# Patient Record
Sex: Male | Born: 1990 | Race: White | Hispanic: No | Marital: Married | Smoking: Light tobacco smoker
Health system: Southern US, Community
[De-identification: ages and names within clinical notes are randomized; demographics above are authoritative.]

## PROBLEM LIST (undated history)

## (undated) DIAGNOSIS — L709 Acne, unspecified: Secondary | ICD-10-CM

## (undated) DIAGNOSIS — Z227 Latent tuberculosis: Secondary | ICD-10-CM

## (undated) HISTORY — DX: Acne, unspecified: L70.9

## (undated) HISTORY — DX: Latent tuberculosis: Z22.7

---
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
## 2010-04-23 ENCOUNTER — Ambulatory Visit: Payer: Self-pay | Admitting: Internal Medicine

## 2010-04-26 LAB — CONVERTED CEMR LAB
ALT: 15 units/L (ref 0–53)
Alkaline Phosphatase: 75 units/L (ref 39–117)
BUN: 15 mg/dL (ref 6–23)
Basophils Relative: 0.5 % (ref 0.0–3.0)
Bilirubin, Direct: 0.1 mg/dL (ref 0.0–0.3)
Calcium: 9.3 mg/dL (ref 8.4–10.5)
Cholesterol: 199 mg/dL (ref 0–200)
Creatinine, Ser: 0.9 mg/dL (ref 0.4–1.5)
Eosinophils Relative: 5 % (ref 0.0–5.0)
GFR calc non Af Amer: 119.89 mL/min (ref >60)
LDL Cholesterol: 130 mg/dL — ABNORMAL HIGH (ref 0–99)
Lymphocytes Relative: 42.3 % (ref 12.0–46.0)
Monocytes Absolute: 0.4 10*3/uL (ref 0.1–1.0)
Neutrophils Relative %: 44.7 % (ref 43.0–77.0)
Platelets: 293 10*3/uL (ref 150.0–400.0)
RBC: 5.23 M/uL (ref 4.22–5.81)
Total Bilirubin: 0.7 mg/dL (ref 0.3–1.2)
Total CHOL/HDL Ratio: 4
Total Protein: 7.3 g/dL (ref 6.0–8.3)
Triglycerides: 108 mg/dL (ref 0.0–149.0)
VLDL: 21.6 mg/dL (ref 0.0–40.0)
WBC: 5.8 10*3/uL (ref 4.5–10.5)

## 2010-10-16 NOTE — Progress Notes (Signed)
Summary: Mom want pt to have Antibiotic  Phone Note Call from Patient   Caller: Mom Call For: Stacie Glaze MD Summary of Call: Pt's Mom wants you to know pt had fever 102.8 Sat, 101 Sun, and 100 yesterday.  Mom really wants him to get an antibiotic today. Initial call taken by: Lynann Beaver CMA,  September 19, 2009 1:38 PM

## 2010-10-16 NOTE — Assessment & Plan Note (Signed)
Summary: SINUSITIS // RS   Vital Signs:  Patient profile:   20 year old male Height:      74.75 inches Weight:      169 pounds BMI:     21.34 Temp:     97.9 degrees F oral Pulse rate:   72 / minute Resp:     12 per minute BP sitting:   110 / 70  (left arm)  Vitals Entered By: Willy Eddy, LPN (September 19, 2009 3:48 PM) CC: c/o sinusitis like sx with facial pressure and fever at home   CC:  c/o sinusitis like sx with facial pressure and fever at home.  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 Use As Directed  Current Medications (verified): 1)  Accutane 80 .... Use As Directed 2)  Amoxicillin 500 Mg Caps (Amoxicillin) .... One By Mouth Three Times A Day 3)  Fexofenadine-Pseudoephedrine 60-120 Mg Xr12h-Tab (Fexofenadine-Pseudoephedrine) .... One By Mouth Two Times A Day For 10 Days  Allergies (verified): No Known Drug Allergies  Past History:  Family History: Last updated: 06/15/2007 Family History Depression Family History of Neurological disorder  Social History: Last updated: 06/15/2007 Single  Past medical, surgical, family and social histories (including risk factors) reviewed, and no changes noted (except as noted below).  Past Medical History: Reviewed history from 08/24/2007 and no changes required. acne  Past Surgical History: Reviewed history from 06/15/2007 and no changes required. Denies surgical history  Family History: Reviewed history from 06/15/2007 and no changes  required. Family History Depression Family History of Neurological disorder  Social History: Reviewed history from 06/15/2007 and no changes required. Single  Review of Systems  The patient denies anorexia, fever, weight loss, weight gain, vision loss, decreased hearing, hoarseness, chest pain, syncope, dyspnea on exertion, peripheral edema, prolonged cough, headaches, hemoptysis, abdominal pain, melena, hematochezia, severe indigestion/heartburn, hematuria, incontinence, genital sores, muscle weakness, suspicious skin lesions, transient blindness, difficulty walking, depression, unusual weight change, abnormal bleeding, enlarged lymph nodes, angioedema, and breast masses.    Physical Exam  General:  Well-developed,well-nourished,in no acute distress; alert,appropriate and cooperative throughout examination Head:  Normocephalic and atraumatic without obvious abnormalities. No apparent alopecia or balding. Eyes:  No corneal or conjunctival inflammation noted. EOMI. Perrla. Funduscopic exam benign, without hemorrhages, exudates or papilledema. Vision grossly normal. Ears:  R ear normal and L ear normal.   Nose:  no external deformity and no nasal discharge.   Neck:  No deformities, masses, or tenderness noted. Lungs:  Normal respiratory effort, chest expands symmetrically. Lungs are clear to auscultation, no crackles or wheezes. Heart:  Normal rate and regular rhythm. S1 and S2 normal without gallop, murmur, click, rub or other extra sounds.   Impression & Recommendations:  Problem # 1:  ACUTE MAXILLARY SINUSITIS (ICD-461.0)  Instructed on treatment. Call if symptoms persist or worsen.   His updated medication list for this problem includes:    Amoxicillin 500 Mg Caps (Amoxicillin) ..... One by mouth three times a  day    Fexofenadine-pseudoephedrine 60-120 Mg Xr12h-tab (Fexofenadine-pseudoephedrine) ..... One by mouth two times a day for 10 days  Complete Medication List: 1)   Accutane 80  .... Use as directed 2)  Amoxicillin 500 Mg Caps (Amoxicillin) .... One by mouth three times a day 3)  Fexofenadine-pseudoephedrine 60-120 Mg Xr12h-tab (Fexofenadine-pseudoephedrine) .... One by mouth two times a day for 10 days  Patient Instructions: 1)  Please schedule an appointment with your primary doctor in : 2)  Take your antibiotic as prescribed until ALL of it is gone, but stop if you develop a rash or swelling and contact our office as soon as possible. Prescriptions: FEXOFENADINE-PSEUDOEPHEDRINE 60-120 MG XR12H-TAB (FEXOFENADINE-PSEUDOEPHEDRINE) one by mouth two times a day for 10 days  #20 x 0   Entered and Authorized by:   Stacie Glaze MD   Signed by:   Stacie Glaze MD on 09/19/2009   Method used:   Electronically to        [id number] (retail)       [address]       [place], [place]  [address]       Ph: [phone] or [phone]       Fax: [phone]   RxID:   [id number] AMOXICILLIN 500 MG CAPS (AMOXICILLIN) one by mouth three times a day  #30 x 0   Entered and Authorized by:   Stacie Glaze MD   Signed by:   Stacie Glaze MD on 09/19/2009   Method used:   Electronically to        [id number] (retail)       [address]       [place], [place]  [address]       Ph: [phone] or [phone]       Fax: [phone]   RxID:   [id number]

## 2010-10-16 NOTE — Assessment & Plan Note (Signed)
Summary: CPX // RS   Vital Signs:  Patient profile:   20 year old male Height:      74.75 inches Weight:      172 pounds BMI:     21.72 Temp:     98.2 degrees F oral Pulse rate:   72 / minute Resp:     14 per minute BP sitting:   122 / 78  (left arm)  Vitals Entered By: Willy Eddy, LPN (April 23, 2010 3:14 PM) CC: cpx- no labs Is Patient Diabetic? No   CC:  cpx- no labs.  History of Present Illness: The pt was asked about all immunizations, health maint. 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 has a hx of a vericocoele  Preventive Screening-Counseling & Management  Alcohol-Tobacco     Smoking Status: never  Current Problems (verified): 1)  Acute Maxillary Sinusitis  (ICD-461.0) 2)  Headache  (ICD-784.0) 3)  Acne Vulgaris  (ICD-706.1) 4)  Concussion With Loc of 30 Minutes or Less  (ICD-850.11) 5)  Syncope  (ICD-780.2) 6)  Acne Nec  (ICD-706.1) 7)  Preventive Health Care  (ICD-V70.0) 8)  Gynecomastia  (ICD-611.1) 9)  Family History Depression  (ICD-V17.0)  Current Medications (verified): 1)  None  Allergies (verified): No Known Drug Allergies  Past History:  Family History: Last updated: 06/15/2007 Family History Depression Family History of Neurological disorder  Social History: Last updated: 06/15/2007 Single  Risk Factors: Smoking Status: never (04/23/2010)  Past medical, surgical, family and social histories (including risk factors) reviewed, and no changes noted (except as noted below).  Past Medical History: Reviewed history from 08/24/2007 and no changes required. acne  Past Surgical History: Reviewed history from 06/15/2007 and no changes required. Denies surgical history  Family History: Reviewed history from 06/15/2007 and no changes required. Family History  Depression Family History of Neurological disorder  Social History: Reviewed history from 06/15/2007 and no changes required. Single Smoking Status:  never  Review of Systems  The patient denies anorexia, fever, weight loss, weight gain, vision loss, decreased hearing, hoarseness, chest pain, syncope, dyspnea on exertion, peripheral edema, prolonged cough, headaches, hemoptysis, abdominal pain, melena, hematochezia, severe indigestion/heartburn, hematuria, incontinence, genital sores, muscle weakness, suspicious skin lesions, transient blindness, difficulty walking, depression, unusual weight change, abnormal bleeding, enlarged lymph nodes, angioedema, and breast masses.    Physical Exam  General:  Well-developed,well-nourished,in no acute distress; alert,appropriate and cooperative throughout examination Head:  Normocephalic and atraumatic without obvious abnormalities. No apparent alopecia or balding. Eyes:  pupils equal and pupils round.   Ears:  R ear normal and L ear normal.   Nose:  no external deformity and no nasal discharge.   Neck:  No deformities, masses, or tenderness noted. Lungs:  Normal respiratory effort, chest expands symmetrically. Lungs are clear to auscultation, no crackles or wheezes. Heart:  Normal rate and regular rhythm. S1 and S2 normal without gallop, murmur, click, rub or other extra sounds. Abdomen:  non-tender and normal bowel sounds.   Genitalia:  R varicocele.   Msk:  No deformity or scoliosis noted of thoracic or lumbar spine.   Pulses:  R and L carotid,radial,femoral,dorsalis pedis and posterior tibial pulses are full and equal bilaterally Neurologic:  No cranial nerve deficits noted. Station and gait are normal. Plantar reflexes are down-going bilaterally. DTRs are  symmetrical throughout. Sensory, motor and coordinative functions appear intact.   Impression & Recommendations:  Problem # 1:  PREVENTIVE HEALTH CARE (ICD-V70.0)  Td Booster: Historical  (05/04/2004)    Discussed using sunscreen, use of alcohol, drug use, self testicular exam, routine dental care, routine eye care, routine physical exam, seat belts, multiple vitamins, osteoporosis prevention, adequate calcium intake in diet, and recommendations for immunizations.  Discussed exercise and checking cholesterol.  Discussed gun safety, safe sex, and contraception. Also recommend checking PSA.  Orders: Venipuncture ([address]) TLB-BMP (Basic Metabolic Panel-BMET) (80048-METABOL) TLB-CBC Platelet - w/Differential (85025-CBCD) TLB-Hepatic/Liver Function Pnl (80076-HEPATIC) TLB-TSH (Thyroid Stimulating Hormone) (84443-TSH) TLB-Lipid Panel (80061-LIPID) UA Dipstick w/o Micro (automated)  (81003)  Patient Instructions: 1)  Please schedule a follow-up appointment as needed.    Appended Document: Orders Update    Clinical Lists Changes  Orders: Added new Service order of Venipuncture (60454) - Signed Added new Service order of Specimen Handling (09811) - Signed      Appended Document: Orders Update    Clinical Lists Changes  Observations: Added new observation of WBC DIPSTK U: negative (04/23/2010 16:50) Added new observation of UROBILINOGEN: 1.0  (04/23/2010 16:50) Added new observation of PH URINE: 5.5  (04/23/2010 16:50) Added new observation of SPEC GR URIN: 1.025  (04/23/2010 16:50) Added new observation of BILIRUBIN UR: negative  (04/23/2010 16:50) Added new observation of NITRITE URN: negative  (04/23/2010 16:50) Added new observation of PROTEIN, URN: 1+  (04/23/2010 16:50) Added new observation of BLOOD UR DIP: negative  (04/23/2010 16:50) Added new observation of KETONES URN: negative  (04/23/2010 16:50) Added new observation of GLUCOSE, URN: negative  (04/23/2010 16:50) Added new observation of APPEARANCE U: Clear  (04/23/2010 16:50) Added new observation of UA COLOR: yellow  (04/23/2010 16:50) Added new observation of COMMENTS: Kathrynn Speed CMA  April 23, 2010 4:50 PM    (04/23/2010 16:50)      Laboratory Results   Urine Tests  Date/Time Received: April 23, 2010   Routine Urinalysis   Color: yellow Appearance: Clear Glucose: negative   (Normal Range: Negative) Bilirubin: negative   (Normal Range: Negative) Ketone: negative   (Normal Range: Negative) Spec. Gravity: 1.025   (Normal Range: 1.003-1.035) Blood: negative   (Normal Range: Negative) pH: 5.5   (Normal Range: 5.0-8.0) Protein: 1+   (Normal Range: Negative) Urobilinogen: 1.0   (Normal Range: 0-1) Nitrite: negative   (Normal Range: Negative) Leukocyte Esterace: negative   (Normal Range: Negative)    Comments: Kathrynn Speed CMA  April 23, 2010 4:50 PM

## 2011-01-28 ENCOUNTER — Telehealth: Payer: Self-pay | Admitting: *Deleted

## 2011-01-28 NOTE — Telephone Encounter (Signed)
Left message on machine Ready for pick up 

## 2011-01-28 NOTE — Telephone Encounter (Signed)
Needs a copy of his shot.  Mom want to pick it up this afternoon at 4:14 pm.

## 2011-05-03 ENCOUNTER — Encounter: Payer: Self-pay | Admitting: Internal Medicine

## 2011-05-03 ENCOUNTER — Ambulatory Visit (INDEPENDENT_AMBULATORY_CARE_PROVIDER_SITE_OTHER): Payer: BC Managed Care – PPO | Admitting: Internal Medicine

## 2011-05-03 VITALS — BP 120/70 | HR 60 | Temp 98.7°F | Resp 14 | Ht 72.0 in | Wt 173.0 lb

## 2011-05-03 DIAGNOSIS — L723 Sebaceous cyst: Secondary | ICD-10-CM

## 2011-05-03 MED ORDER — DOXYCYCLINE HYCLATE 100 MG PO TABS: 100.0000 mg | ORAL_TABLET | Freq: Two times a day (BID) | ORAL | Status: AC

## 2011-05-03 NOTE — Progress Notes (Signed)
  Subjective:    Patient ID: Mark Mckenzie, male    DOB: 06/14/1991, 20 y.o.   MRN: [id number]  HPI Cyst behind ear on the left Cystic acne No fever of chill No ear aches Dry skin    Review of Systems  Constitutional: Negative for fever and fatigue.  HENT: Negative for hearing loss, congestion, neck pain and postnasal drip.   Eyes: Negative for discharge, redness and visual disturbance.  Respiratory: Negative for cough, shortness of breath and wheezing.   Cardiovascular: Negative for leg swelling.  Gastrointestinal: Negative for abdominal pain, constipation and abdominal distention.  Genitourinary: Negative for urgency and frequency.  Musculoskeletal: Negative for joint swelling and arthralgias.  Skin: Negative for color change and rash.  Neurological: Negative for weakness and light-headedness.  Hematological: Negative for adenopathy.  Psychiatric/Behavioral: Negative for behavioral problems.   No past medical history on file. No past surgical history on file.  reports that he has never smoked. He does not have any smokeless tobacco history on file. He reports that he does not drink alcohol or use illicit drugs. family history is not on file. Allergies not on file     Objective:   Physical Exam  Nursing note and vitals reviewed. Constitutional: He appears well-developed and well-nourished.  HENT:  Head: Normocephalic and atraumatic.  Eyes: Conjunctivae are normal. Pupils are equal, round, and reactive to light.  Neck: Normal range of motion. Neck supple.  Cardiovascular: Normal rate and regular rhythm.   Pulmonary/Chest: Effort normal and breath sounds normal.  Abdominal: Soft. Bowel sounds are normal.  Skin:       2 cm soft nontender cyst behind left ear          Assessment & Plan:   differential diagnosis include sebaceous cyst and possible lipoma placed on Keflex 500 mg by mouth 4 times a day for 7 days and monitored should this cyst persist he be referred  to general surgeon for excision

## 2011-07-01 LAB — DIFFERENTIAL
Basophils Absolute: 0.2 — ABNORMAL HIGH
Basophils Relative: 2 — ABNORMAL HIGH
Lymphocytes Relative: 20 — ABNORMAL LOW
Monocytes Absolute: 0.6
Neutro Abs: 10.8 — ABNORMAL HIGH
Neutrophils Relative %: 74 — ABNORMAL HIGH

## 2011-07-01 LAB — CBC
HCT: 45.6
Platelets: 389 — ABNORMAL HIGH
RDW: 11.5
WBC: 14.6 — ABNORMAL HIGH

## 2011-07-01 LAB — URINALYSIS, ROUTINE W REFLEX MICROSCOPIC
Bilirubin Urine: NEGATIVE
Leukocytes, UA: NEGATIVE
Nitrite: NEGATIVE
Specific Gravity, Urine: 1.034 — ABNORMAL HIGH
Urobilinogen, UA: 1
pH: 6

## 2011-07-01 LAB — BASIC METABOLIC PANEL
BUN: 16
Calcium: 9.4
Glucose, Bld: 127 — ABNORMAL HIGH
Potassium: 3.7
Sodium: 139

## 2011-07-01 LAB — URINE MICROSCOPIC-ADD ON

## 2012-01-23 ENCOUNTER — Ambulatory Visit (INDEPENDENT_AMBULATORY_CARE_PROVIDER_SITE_OTHER): Payer: BC Managed Care – PPO | Admitting: Family

## 2012-01-23 ENCOUNTER — Encounter: Payer: Self-pay | Admitting: Family

## 2012-01-23 VITALS — BP 112/70 | HR 60 | Ht 74.5 in | Wt 180.0 lb

## 2012-01-23 DIAGNOSIS — Z23 Encounter for immunization: Secondary | ICD-10-CM

## 2012-01-23 DIAGNOSIS — Z0000 Encounter for general adult medical examination without abnormal findings: Secondary | ICD-10-CM

## 2012-01-23 LAB — POCT URINALYSIS DIPSTICK
Blood, UA: NEGATIVE
Ketones, UA: NEGATIVE
Protein, UA: NEGATIVE
Spec Grav, UA: 1.02
Urobilinogen, UA: 0.2
pH, UA: 8.5

## 2012-01-23 NOTE — Progress Notes (Signed)
  Subjective:    Patient ID: Mark Mckenzie, male    DOB: 1991-03-11, 21 y.o.   MRN: [id number]  HPI 21 year old B[place]her male, student at [employer] is in today for complete physical exam. He is attempting to get a visa to this pain for 3-1/2 months. Study and a broad, to learn [place] , majoring in global studies and Nurse, children's. He exercises 1-1-1/2 hours 5 days a week. Denies any concerns today.   Review of Systems  Constitutional: Negative.   HENT: Negative.   Eyes: Negative.   Respiratory: Negative.   Cardiovascular: Negative.   Gastrointestinal: Negative.   Genitourinary: Negative.   Musculoskeletal: Negative.   Skin: Negative.   Neurological: Negative.   Hematological: Negative.   Psychiatric/Behavioral: Negative.    No past medical history on file.  History   Social History  . Marital Status: Single    Spouse Name: N/A    Number of Children: N/A  . Years of Education: N/A   Occupational History  . Not on file.   Social History Main Topics  . Smoking status: Never Smoker   . Smokeless tobacco: Not on file  . Alcohol Use: No  . Drug Use: No  . Sexually Active: Not on file   Other Topics Concern  . Not on file   Social History Narrative  . No narrative on file    No past surgical history on file.  No family history on file.  No Known Allergies  No current outpatient prescriptions on file prior to visit.    BP 112/70  Pulse 60  Ht 6' 2.5" (1.892 m)  Wt 180 lb (81.647 kg)  BMI 22.80 kg/m2chart    Objective:   Physical Exam  Constitutional: He is oriented to person, place, and time. He appears well-developed and well-nourished.  HENT:  Head: Normocephalic and atraumatic.  Right Ear: External ear normal.  Left Ear: External ear normal.  Nose: Nose normal.  Mouth/Throat: Oropharynx is clear and moist.  Eyes: Conjunctivae are normal. Pupils are equal, round, and reactive to light.  Neck: Normal range of motion. Neck  supple.  Cardiovascular: Normal rate, regular rhythm and normal heart sounds.   Pulmonary/Chest: Effort normal and breath sounds normal.  Abdominal: Soft. Bowel sounds are normal.  Genitourinary: Testes normal and penis normal.  Neurological: He is alert and oriented to person, place, and time.  Skin: Skin is warm and dry.  Psychiatric: He has a normal mood and affect.          Assessment & Plan:  Assessment: Complete physical exam, need for TDAP  Plan: Tdap administered. Anticipatory guidance appropriate for age to include avoidance affect, alcohol, drugs. We'll follow the patient in one year and sooner when necessary.

## 2012-02-20 ENCOUNTER — Telehealth: Payer: Self-pay | Admitting: Internal Medicine

## 2012-02-20 NOTE — Telephone Encounter (Signed)
Pt was in to see Mark Mckenzie on 01/23/12 and had a cpx and also rcvd a tetanus booster. The vax was coded as a procedure and got rcvd a charge in addition to paying copay. Pts mother req a call back.

## 2012-02-21 NOTE — Telephone Encounter (Signed)
Taking care of it. Thanks!

## 2012-09-08 ENCOUNTER — Telehealth: Payer: Self-pay | Admitting: Internal Medicine

## 2012-09-08 ENCOUNTER — Ambulatory Visit (INDEPENDENT_AMBULATORY_CARE_PROVIDER_SITE_OTHER): Payer: BC Managed Care – PPO | Admitting: Internal Medicine

## 2012-09-08 ENCOUNTER — Encounter: Payer: Self-pay | Admitting: Internal Medicine

## 2012-09-08 VITALS — BP 120/80 | HR 99 | Temp 98.8°F | Wt 180.0 lb

## 2012-09-08 DIAGNOSIS — R6889 Other general symptoms and signs: Secondary | ICD-10-CM

## 2012-09-08 DIAGNOSIS — J019 Acute sinusitis, unspecified: Secondary | ICD-10-CM

## 2012-09-08 DIAGNOSIS — J111 Influenza due to unidentified influenza virus with other respiratory manifestations: Secondary | ICD-10-CM

## 2012-09-08 MED ORDER — AMOXICILLIN 500 MG PO CAPS: 1000.0000 mg | ORAL_CAPSULE | Freq: Three times a day (TID) | ORAL | Status: DC

## 2012-09-08 NOTE — Patient Instructions (Addendum)
We are going to treat for sinusitis because of your prolonged sx and now fever.  IT is possible that you have a mild sinus infection and flu also which is viral and runs its course with bed rest and fluids. Do not exercise with fevers.  Contact us if not improved in  3 days or so .    Influenza, Adult Influenza ("the flu") is a viral infection of the respiratory tract. It occurs more often in winter months because people spend more time in close contact with one another. Influenza can make you feel very sick. Influenza easily spreads from person to person (contagious). CAUSES  Influenza is caused by a virus that infects the respiratory tract. You can catch the virus by breathing in droplets from an infected person's cough or sneeze. You can also catch the virus by touching something that was recently contaminated with the virus and then touching your mouth, nose, or eyes. SYMPTOMS  Symptoms typically last 4 to 10 days and may include:  Fever.  Chills.  Headache, body aches, and muscle aches.  Sore throat.  Chest discomfort and cough.  Poor appetite.  Weakness or feeling tired.  Dizziness.  Nausea or vomiting. DIAGNOSIS  Diagnosis of influenza is often made based on your history and a physical exam. A nose or throat swab test can be done to confirm the diagnosis. RISKS AND COMPLICATIONS You may be at risk for a more severe case of influenza if you smoke cigarettes, have diabetes, have chronic heart disease (such as heart failure) or lung disease (such as asthma), or if you have a weakened immune system. Elderly people and pregnant women are also at risk for more serious infections. The most common complication of influenza is a lung infection (pneumonia). Sometimes, this complication can require emergency medical care and may be life-threatening. PREVENTION  An annual influenza vaccination (flu shot) is the best way to avoid getting influenza. An annual flu shot is now routinely  recommended for all adults in the [place] TREATMENT  In mild cases, influenza goes away on its own. Treatment is directed at relieving symptoms. For more severe cases, your caregiver may prescribe antiviral medicines to shorten the sickness. Antibiotic medicines are not effective, because the infection is caused by a virus, not by bacteria. HOME CARE INSTRUCTIONS  Only take over-the-counter or prescription medicines for pain, discomfort, or fever as directed by your caregiver.  Use a cool mist humidifier to make breathing easier.  Get plenty of rest until your temperature returns to normal. This usually takes 3 to 4 days.  Drink enough fluids to keep your urine clear or pale yellow.  Cover your mouth and nose when coughing or sneezing, and wash your hands well to avoid spreading the virus.  Stay home from work or school until your fever has been gone for at least 1 full day. SEEK MEDICAL CARE IF:   You have chest pain or a deep cough that worsens or produces more mucus.  You have nausea, vomiting, or diarrhea. SEEK IMMEDIATE MEDICAL CARE IF:   You have difficulty breathing, shortness of breath, or your skin or nails turn bluish.  You have severe neck pain or stiffness.  You have a severe headache, facial pain, or earache.  You have a worsening or recurring fever.  You have nausea or vomiting that cannot be controlled. MAKE SURE YOU:  Understand these instructions.  Will watch your condition.  Will get help right away if you are not doing well or  get worse. Document Released: 08/30/2000 Document Revised: 03/03/2012 Document Reviewed: 12/02/2011 [facility] Patient Information 2013 [place], [place]. Sinusitis Sinusitis is redness, soreness, and swelling (inflammation) of the paranasal sinuses. Paranasal sinuses are air pockets within the bones of your face (beneath the eyes, the middle of the forehead, or above the eyes). In healthy paranasal sinuses, mucus is able to drain out, and  air is able to circulate through them by way of your nose. However, when your paranasal sinuses are inflamed, mucus and air can become trapped. This can allow bacteria and other germs to grow and cause infection. Sinusitis can develop quickly and last only a short time (acute) or continue over a long period (chronic). Sinusitis that lasts for more than 12 weeks is considered chronic.  CAUSES  Causes of sinusitis include:  Allergies.  Structural abnormalities, such as displacement of the cartilage that separates your nostrils (deviated septum), which can decrease the air flow through your nose and sinuses and affect sinus drainage.  Functional abnormalities, such as when the small hairs (cilia) that line your sinuses and help remove mucus do not work properly or are not present. SYMPTOMS  Symptoms of acute and chronic sinusitis are the same. The primary symptoms are pain and pressure around the affected sinuses. Other symptoms include:  Upper toothache.  Earache.  Headache.  Bad breath.  Decreased sense of smell and taste.  A cough, which worsens when you are lying flat.  Fatigue.  Fever.  Thick drainage from your nose, which often is green and may contain pus (purulent).  Swelling and warmth over the affected sinuses. DIAGNOSIS  Your caregiver will perform a physical exam. During the exam, your caregiver may:  Look in your nose for signs of abnormal growths in your nostrils (nasal polyps).  Tap over the affected sinus to check for signs of infection.  View the inside of your sinuses (endoscopy) with a special imaging device with a light attached (endoscope), which is inserted into your sinuses. If your caregiver suspects that you have chronic sinusitis, one or more of the following tests may be recommended:  Allergy tests.  Nasal culture A sample of mucus is taken from your nose and sent to a lab and screened for bacteria.  Nasal cytology A sample of mucus is taken from  your nose and examined by your caregiver to determine if your sinusitis is related to an allergy. TREATMENT  Most cases of acute sinusitis are related to a viral infection and will resolve on their own within 10 days. Sometimes medicines are prescribed to help relieve symptoms (pain medicine, decongestants, nasal steroid sprays, or saline sprays).  However, for sinusitis related to a bacterial infection, your caregiver will prescribe antibiotic medicines. These are medicines that will help kill the bacteria causing the infection.  Rarely, sinusitis is caused by a fungal infection. In theses cases, your caregiver will prescribe antifungal medicine. For some cases of chronic sinusitis, surgery is needed. Generally, these are cases in which sinusitis recurs more than 3 times per year, despite other treatments. HOME CARE INSTRUCTIONS   Drink plenty of water. Water helps thin the mucus so your sinuses can drain more easily.  Use a humidifier.  Inhale steam 3 to 4 times a day (for example, sit in the bathroom with the shower running).  Apply a warm, moist washcloth to your face 3 to 4 times a day, or as directed by your caregiver.  Use saline nasal sprays to help moisten and clean your sinuses.  Take  over-the-counter or prescription medicines for pain, discomfort, or fever only as directed by your caregiver. SEEK IMMEDIATE MEDICAL CARE IF:  You have increasing pain or severe headaches.  You have nausea, vomiting, or drowsiness.  You have swelling around your face.  You have vision problems.  You have a stiff neck.  You have difficulty breathing. MAKE SURE YOU:   Understand these instructions.  Will watch your condition.  Will get help right away if you are not doing well or get worse. Document Released: 09/02/2005 Document Revised: 11/25/2011 Document Reviewed: 09/17/2011 [facility] Patient Information 2013 [place], [place].

## 2012-09-08 NOTE — Telephone Encounter (Signed)
Patient Information:  Caller Name: Claris Che  Phone: [phone]  Patient: Mark, Mckenzie  Gender: Male  DOB: [birth year]  Age: 21 Years  PCP: Darryll Capers (Adults only)  Office Follow Up:  Does the office need to follow up with this patient?: No  Instructions For The Office: N/A   Symptoms  Reason For Call & Symptoms: Fever and congestion sinuses and chest started Sat 12/14.  Had been in [place] for 4 months - returning home Sun pm 12/15.  Lot of head pressure on plane.  Pressure in ears, has to keep popping  them.  Fever to 101 since yesterday 12/23  Reviewed Health History In EMR: Yes  Reviewed Medications In EMR: Yes  Reviewed Allergies In EMR: Yes  Reviewed Surgeries / Procedures: Yes  Date of Onset of Symptoms: 08/29/2012  Treatments Tried: Advil Sinus Cold OTC Rx, Advil  Treatments Tried Worked: No  Any Fever: Yes  Fever Taken: Oral  Fever Time Of Reading: 04:00:00  Fever Last Reading: 101  Guideline(s) Used:  Colds  Disposition Per Guideline:   See Today in Office  Reason For Disposition Reached:   Sinus pain (not just congestion) and fever  Advice Given:  N/A  Appointment Scheduled:  09/08/2012 11:30:00 Appointment Scheduled Provider:  Berniece Andreas (Family Practice)

## 2012-09-08 NOTE — Progress Notes (Signed)
Chief Complaint  Patient presents with  . Sinusitis    HPI:  Patient comes in today for SDA for  new problem evaluation.pcp NA .  [place] became ill and then flew back onset of upper respiratory sinus pressure pain and congestion was about Dec 15 or thereabouts had sinus ain and pressure and then improved .  A bit but never totally went away would feel badly at night and in the morning but feel better in the afternoon. However last pm  Got a lot worse   fever 100.8 last pm and hot an flush and chills .  [place] mocus from the nose last week and now clearer no specific severe pain at this moment but gets it in the front of his face and head. Getting cholls pressure frontal and  Ears. Dizzy . Feeling at times. This week he has continued to play basketball but doesn't feel as well today has taken Advil with some help no specific decongestants today. No vomiting diarrhea or unusual rashes. Doesn't usually gets this sick. Did not have the flu shot as never gets the flu. ROS: See pertinent positives and negatives per HPI. Denies a specific cough. He does have some myalgias.  Past Medical History  Diagnosis Date  . Acne     Family History  Problem Relation Age of Onset  . Depression Other   . Neurologic Disorder Other     History   Social History  . Marital Status: Single    Spouse Name: N/A    Number of Children: N/A  . Years of Education: N/A   Social History Main Topics  . Smoking status: Never Smoker   . Smokeless tobacco: None  . Alcohol Use: No  . Drug Use: No  . Sexually Active: None   Other Topics Concern  . None   Social History Narrative  . None    Outpatient Encounter Prescriptions as of 09/08/2012  Medication Sig Dispense Refill  . amoxicillin (AMOXIL) 500 MG capsule Take 2 capsules (1,000 mg total) by mouth 3 (three) times daily. For sinusitis  60 capsule  0    EXAM:  BP 120/80  Pulse 99  Temp 98.8 F (37.1 C) (Oral)  Wt 180 lb (81.647 kg)   SpO2 99%  There is no height on file to calculate BMI.  GENERAL: vitals reviewed and listed above, alert, oriented, appears well hydrated and in no acute distress looks ill but nontoxic oriented normal speech and affect.  HEENT: Normocephalic ;atraumatic , Eyes;  PERRL, EOMs  Full, lids and conjunctiva clear,,Ears: no deformities, canals nl, TM landmarks normal, Nose: no deformity or discharge congested some crusting in the right nostril face not particularly tender  Mouth : OP clear without lesion or edema . Some red streaking on the right posterior pharynx NECK: no obvious masses on inspection palpation no adenopathy LUNGS: clear to auscultation bilaterally, no wheezes, rales or rhonchi, good air movement CV: HRRR, no clubbing cyanosis or  peripheral edema nl cap refill  ``Abdomen:  Sof,t normal bowel sounds without hepatosplenomegaly, no guarding rebound or masses no CVA tenderness Skin: normal capillary refill ,turgor , color: No acute rashes ,petechiae or bruising  MS: moves all extremities without noticeable focal  abnormality  PSYCH: pleasant and cooperative, no obvious depression or anxiety  ASSESSMENT AND PLAN:  Discussed the following assessment and plan:  1. Acute sinusitis with symptoms greater than 10 days   2. Flu-like symptoms    overall this could all be  flu with a mild rub viral respiratory infection however because of persistence of his symptoms and worsening would treat for bacterial sinusitis no evidence of pneumonia on exam today discussed this with patient and mom alarm findings to followup. Discussed side effect potential of high-dose medications amoxicillin.  -Patient advised to return or notify health care team  immediately if symptoms worsen or persist or new concerns arise.  Patient Instructions  We are going to treat for sinusitis because of your prolonged sx and now fever.  IT is possible that you have a mild sinus infection and flu also which is viral and  runs its course with bed rest and fluids. Do not exercise with fevers.  Contact us if not improved in  3 days or so .    Influenza, Adult Influenza ("the flu") is a viral infection of the respiratory tract. It occurs more often in winter months because people spend more time in close contact with one another. Influenza can make you feel very sick. Influenza easily spreads from person to person (contagious). CAUSES  Influenza is caused by a virus that infects the respiratory tract. You can catch the virus by breathing in droplets from an infected person's cough or sneeze. You can also catch the virus by touching something that was recently contaminated with the virus and then touching your mouth, nose, or eyes. SYMPTOMS  Symptoms typically last 4 to 10 days and may include:  Fever.  Chills.  Headache, body aches, and muscle aches.  Sore throat.  Chest discomfort and cough.  Poor appetite.  Weakness or feeling tired.  Dizziness.  Nausea or vomiting. DIAGNOSIS  Diagnosis of influenza is often made based on your history and a physical exam. A nose or throat swab test can be done to confirm the diagnosis. RISKS AND COMPLICATIONS You may be at risk for a more severe case of influenza if you smoke cigarettes, have diabetes, have chronic heart disease (such as heart failure) or lung disease (such as asthma), or if you have a weakened immune system. Elderly people and pregnant women are also at risk for more serious infections. The most common complication of influenza is a lung infection (pneumonia). Sometimes, this complication can require emergency medical care and may be life-threatening. PREVENTION  An annual influenza vaccination (flu shot) is the best way to avoid getting influenza. An annual flu shot is now routinely recommended for all adults in the [place] TREATMENT  In mild cases, influenza goes away on its own. Treatment is directed at relieving symptoms. For more severe cases,  your caregiver may prescribe antiviral medicines to shorten the sickness. Antibiotic medicines are not effective, because the infection is caused by a virus, not by bacteria. HOME CARE INSTRUCTIONS  Only take over-the-counter or prescription medicines for pain, discomfort, or fever as directed by your caregiver.  Use a cool mist humidifier to make breathing easier.  Get plenty of rest until your temperature returns to normal. This usually takes 3 to 4 days.  Drink enough fluids to keep your urine clear or pale yellow.  Cover your mouth and nose when coughing or sneezing, and wash your hands well to avoid spreading the virus.  Stay home from work or school until your fever has been gone for at least 1 full day. SEEK MEDICAL CARE IF:   You have chest pain or a deep cough that worsens or produces more mucus.  You have nausea, vomiting, or diarrhea. SEEK IMMEDIATE MEDICAL CARE IF:   You have  difficulty breathing, shortness of breath, or your skin or nails turn bluish.  You have severe neck pain or stiffness.  You have a severe headache, facial pain, or earache.  You have a worsening or recurring fever.  You have nausea or vomiting that cannot be controlled. MAKE SURE YOU:  Understand these instructions.  Will watch your condition.  Will get help right away if you are not doing well or get worse. Document Released: 08/30/2000 Document Revised: 03/03/2012 Document Reviewed: 12/02/2011 [facility] Patient Information 2013 [place], [place]. Sinusitis Sinusitis is redness, soreness, and swelling (inflammation) of the paranasal sinuses. Paranasal sinuses are air pockets within the bones of your face (beneath the eyes, the middle of the forehead, or above the eyes). In healthy paranasal sinuses, mucus is able to drain out, and air is able to circulate through them by way of your nose. However, when your paranasal sinuses are inflamed, mucus and air can become trapped. This can allow bacteria  and other germs to grow and cause infection. Sinusitis can develop quickly and last only a short time (acute) or continue over a long period (chronic). Sinusitis that lasts for more than 12 weeks is considered chronic.  CAUSES  Causes of sinusitis include:  Allergies.  Structural abnormalities, such as displacement of the cartilage that separates your nostrils (deviated septum), which can decrease the air flow through your nose and sinuses and affect sinus drainage.  Functional abnormalities, such as when the small hairs (cilia) that line your sinuses and help remove mucus do not work properly or are not present. SYMPTOMS  Symptoms of acute and chronic sinusitis are the same. The primary symptoms are pain and pressure around the affected sinuses. Other symptoms include:  Upper toothache.  Earache.  Headache.  Bad breath.  Decreased sense of smell and taste.  A cough, which worsens when you are lying flat.  Fatigue.  Fever.  Thick drainage from your nose, which often is green and may contain pus (purulent).  Swelling and warmth over the affected sinuses. DIAGNOSIS  Your caregiver will perform a physical exam. During the exam, your caregiver may:  Look in your nose for signs of abnormal growths in your nostrils (nasal polyps).  Tap over the affected sinus to check for signs of infection.  View the inside of your sinuses (endoscopy) with a special imaging device with a light attached (endoscope), which is inserted into your sinuses. If your caregiver suspects that you have chronic sinusitis, one or more of the following tests may be recommended:  Allergy tests.  Nasal culture A sample of mucus is taken from your nose and sent to a lab and screened for bacteria.  Nasal cytology A sample of mucus is taken from your nose and examined by your caregiver to determine if your sinusitis is related to an allergy. TREATMENT  Most cases of acute sinusitis are related to a viral  infection and will resolve on their own within 10 days. Sometimes medicines are prescribed to help relieve symptoms (pain medicine, decongestants, nasal steroid sprays, or saline sprays).  However, for sinusitis related to a bacterial infection, your caregiver will prescribe antibiotic medicines. These are medicines that will help kill the bacteria causing the infection.  Rarely, sinusitis is caused by a fungal infection. In theses cases, your caregiver will prescribe antifungal medicine. For some cases of chronic sinusitis, surgery is needed. Generally, these are cases in which sinusitis recurs more than 3 times per year, despite other treatments. HOME CARE INSTRUCTIONS   Drink  plenty of water. Water helps thin the mucus so your sinuses can drain more easily.  Use a humidifier.  Inhale steam 3 to 4 times a day (for example, sit in the bathroom with the shower running).  Apply a warm, moist washcloth to your face 3 to 4 times a day, or as directed by your caregiver.  Use saline nasal sprays to help moisten and clean your sinuses.  Take over-the-counter or prescription medicines for pain, discomfort, or fever only as directed by your caregiver. SEEK IMMEDIATE MEDICAL CARE IF:  You have increasing pain or severe headaches.  You have nausea, vomiting, or drowsiness.  You have swelling around your face.  You have vision problems.  You have a stiff neck.  You have difficulty breathing. MAKE SURE YOU:   Understand these instructions.  Will watch your condition.  Will get help right away if you are not doing well or get worse. Document Released: 09/02/2005 Document Revised: 11/25/2011 Document Reviewed: 09/17/2011 [facility] Patient Information 2013 [place], [place].      Neta Mends. Panosh M.D.

## 2012-09-11 ENCOUNTER — Telehealth: Payer: Self-pay | Admitting: Internal Medicine

## 2012-09-11 ENCOUNTER — Ambulatory Visit (INDEPENDENT_AMBULATORY_CARE_PROVIDER_SITE_OTHER): Payer: BC Managed Care – PPO | Admitting: Family Medicine

## 2012-09-11 ENCOUNTER — Encounter: Payer: Self-pay | Admitting: Family Medicine

## 2012-09-11 ENCOUNTER — Other Ambulatory Visit: Payer: Self-pay | Admitting: *Deleted

## 2012-09-11 VITALS — BP 110/70 | HR 72 | Temp 99.2°F | Resp 16 | Ht 72.25 in | Wt 178.0 lb

## 2012-09-11 DIAGNOSIS — K120 Recurrent oral aphthae: Secondary | ICD-10-CM

## 2012-09-11 MED ORDER — MAGIC MOUTHWASH: 10.0000 mL | Freq: Four times a day (QID) | ORAL | Status: DC | PRN

## 2012-09-11 NOTE — Patient Instructions (Addendum)
Canker Sores   Canker sores are painful, open sores on the inside of the mouth and cheek. They may be [place]wey or yellow. The sores usually heal in 1 to 2 weeks. Women are more likely than men to have recurrent canker sores.  CAUSES  The cause of canker sores is not well understood. More than one cause is likely. Canker sores do not appear to be caused by certain types of germs (viruses or bacteria). Canker sores may be caused by:   An allergic reaction to certain foods.   Digestive problems.   Not having enough vitamin B12, folic acid, and iron.   Male sex hormones. Sores may come only during certain phases of a menstrual cycle. Often, there is improvement during pregnancy.   Genetics. Some people seem to inherit canker sore problems.  Emotional stress and injuries to the mouth may trigger outbreaks, but not cause them.   DIAGNOSIS  Canker sores are diagnosed by exam.   TREATMENT   Patients who have frequent bouts of canker sores may have cultures taken of the sores, blood tests, or allergy tests. This helps determine if their sores are caused by a poor diet, an allergy, or some other preventable or treatable disease.   Vitamins may prevent recurrences or reduce the severity of canker sores in people with poor nutrition.   Numbing ointments can relieve pain. These are available in drug stores without a prescription.   Anti-inflammatory steroid mouth rinses or gels may be prescribed by your caregiver for severe sores.   Oral steroids may be prescribed if you have severe, recurrent canker sores. These strong medicines can cause many side effects and should be used only under the close direction of a dentist or physician.   Mouth rinses containing the antibiotic medicine may be prescribed. They may lessen symptoms and speed healing.  Healing usually happens in about 1 or 2 weeks with or without treatment. Certain antibiotic mouth rinses given to pregnant women and young children can permanently stain teeth.  Talk to your caregiver about your treatment.  HOME CARE INSTRUCTIONS    Avoid foods that cause canker sores for you.   Avoid citrus juices, spicy or salty foods, and coffee until the sores are healed.   Use a soft-bristled toothbrush.   Chew your food carefully to avoid biting your cheek.   Apply topical numbing medicine to the sore to help relieve pain.   Apply a thin paste of baking soda and water to the sore to help heal the sore.   Only use mouth rinses or medicines for pain or discomfort as directed by your caregiver.  SEEK MEDICAL CARE IF:    Your symptoms are not better in 1 week.   Your sores are still present after 2 weeks.   Your sores are very painful.   You have trouble breathing or swallowing.   Your sores come back frequently.  Document Released: 12/28/2010 Document Revised: 11/25/2011 Document Reviewed: 12/28/2010  [employer] Patient Information 2013 [employer]

## 2012-09-11 NOTE — Telephone Encounter (Signed)
Patient Information:  Caller Name: Claris Che  Phone: [phone]  Patient: Mark Mckenzie, Mark Mckenzie  Gender: Male  DOB: 01-07-1991  Age: 21 Years  PCP: Darryll Capers (Adults only)  Office Follow Up:  Does the office need to follow up with this patient?: No  Instructions For The Office: N/A   Symptoms  Reason For Call & Symptoms: Seen in the office on 09/08/2012 and was given Amoxicillin for Sinus infection.  He continues to have fevers and now has sores on his tongue and the back of his throat.  Reviewed Health History In EMR: Yes  Reviewed Medications In EMR: Yes  Reviewed Allergies In EMR: Yes  Reviewed Surgeries / Procedures: Yes  Date of Onset of Symptoms: 08/30/2012  Treatments Tried: Amoxicillin, "I have taken all kinds of stuff"  Treatments Tried Worked: No  Any Fever: Yes  Fever Taken: Oral  Fever Time Of Reading: 04:00:23  Fever Last Reading: 101.0  Guideline(s) Used:  Sore Throat  Disposition Per Guideline:   See Today in Office  Reason For Disposition Reached:   Severe sore throat pain  Advice Given:  Soft Diet:   Cold drinks and milk shakes are especially good (Reason: swollen tonsils can make some foods hard to swallow).  Liquids:  Adequate liquid intake is important to prevent dehydration. Drink 6-8 glasses of water per day.  Contagiousness:   You can return to work or school after the fever is gone and you feel well enough to participate in normal activities. If your doctor determines that you have Strep throat, then you will need to take an antibiotic for 24 hours before you can return.  Appointment Scheduled:  09/11/2012 11:00:00 Appointment Scheduled Provider:  Evelena Peat [facility])  PCP not available today for scheduling of ACUTE visit

## 2012-09-11 NOTE — Progress Notes (Signed)
  Subjective:    Patient ID: Mark Mckenzie, male    DOB: [birth year], 21 y.o.   MRN: [id number]  HPI Followup for acute respiratory illness. Refer to prior note. 2 weeks ago developed was somewhat typical cold. By December 23 he developed low-grade fever. Patient started on amoxicillin for possible acute sinusitis given duration of symptoms over 10 days. His fever has declined somewhat since starting amoxicillin. 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 He is still swallowing OK but has pain with swallowing. Denies headache. No nausea or vomiting. No skin rashes. Minimal bilateral maxillary sinus pressure which is improving   Review of Systems  Constitutional: Negative for fever and chills.  HENT: Positive for sore throat.   Respiratory: Negative for cough and shortness of breath.   Cardiovascular: Negative for chest pain.  Neurological: Negative for headaches.       Objective:   Physical Exam  Constitutional: He appears well-developed and well-nourished.  HENT:  Right Ear: External ear normal.  Left Ear: External ear normal.       Patient is a couple of scabbed lesions upper lip consistent with cold sores. Posterior pharynx reveals some scattered appetite ulcers. No exudate. Mild erythema  Neck: Neck supple.  Cardiovascular: Normal rate and regular rhythm.   Pulmonary/Chest: Effort normal and breath sounds normal. No respiratory distress. He has no wheezes. He has no rales.  Abdominal: Soft. Bowel sounds are normal. He exhibits no distension and no mass. There is no tenderness. There is no rebound and no guarding.  Lymphadenopathy:    He has cervical adenopathy.  Skin: No rash noted.          Assessment & Plan:  Aphthous ulcers. Explained these are usually viral. Magic mouthwash to use 4 times daily as needed. Finish out amoxicillin. We explained that we did not think  his mouth sores or related to medication-related side effect-NO evidence for [employer] type process.

## 2012-09-14 ENCOUNTER — Telehealth: Payer: Self-pay | Admitting: Internal Medicine

## 2012-09-14 ENCOUNTER — Encounter: Payer: Self-pay | Admitting: Family Medicine

## 2012-09-14 ENCOUNTER — Ambulatory Visit (INDEPENDENT_AMBULATORY_CARE_PROVIDER_SITE_OTHER): Payer: BC Managed Care – PPO | Admitting: Family Medicine

## 2012-09-14 VITALS — BP 110/80 | HR 98 | Temp 98.5°F | Wt 170.0 lb

## 2012-09-14 DIAGNOSIS — L089 Local infection of the skin and subcutaneous tissue, unspecified: Secondary | ICD-10-CM

## 2012-09-14 MED ORDER — MUPIROCIN 2 % EX OINT: TOPICAL_OINTMENT | Freq: Three times a day (TID) | CUTANEOUS | Status: DC

## 2012-09-14 MED ORDER — DOXYCYCLINE HYCLATE 100 MG PO TABS: 100.0000 mg | ORAL_TABLET | Freq: Two times a day (BID) | ORAL | Status: DC

## 2012-09-14 NOTE — Progress Notes (Signed)
  Subjective:    Patient ID: Mark Mckenzie, male    DOB: [birth year], 21 y.o.   MRN: [id number]  HPI Patient presents with some new pustular lesions of face and upper lip region. Refer to multiple notes. He presented with respiratory type illness. Amoxicillin. Subsequently developed appetite ulcers in his throat. He started Magic mouthwash last visit and those seem to be improving. He today with some new pustular type lesions on the face. Apparently had this once before it was treated for possible staph infection. No known history of MRSA. Fevers have resolved. Overall feels improved but has had poor appetite. He is swallowing without difficulty. Had a couple of crusted upper lip lesions last visit and we suspect that these may have been herpetic. Now has a couple of new pustular type lesions upper lip and face   Review of Systems  Constitutional: Negative for fever and chills.  HENT: Positive for sore throat.   Respiratory: Negative for cough and shortness of breath.   Neurological: Negative for headaches.       Objective:   Physical Exam  Constitutional: He appears well-developed and well-nourished.  HENT:       Patient has a couple aphthous ulcers posterior pharynx. No exudate. Upper lip reveals a couple of new pustular type lesions. He has a couple similar skin lesions including one left cheek one right cheek and one right proximal medial nose area  Neck: Neck supple.       He has some mild anterior cervical adenopathy bilaterally in one left posterior cervical node  Cardiovascular: Normal rate and regular rhythm.   Pulmonary/Chest: Effort normal and breath sounds normal. No respiratory distress. He has no wheezes. He has no rales.  Lymphadenopathy:    He has cervical adenopathy.          Assessment & Plan:  Recent upper respiratory infection. Patient now presents with some pustular type lesions involving face. These would not be typical for coxsackie and need to cover for  possible staph. We cultured one lesion right upper lip and will start doxycycline 100 mg twice a day for 10 days and also given Bactroban ointment to use on upper lip lesions. He is instructed to followup immediately for any recurrent fever or worsening rash

## 2012-09-14 NOTE — Patient Instructions (Addendum)
Follow up promptly for any fever or worsening rash.

## 2012-09-14 NOTE — Telephone Encounter (Signed)
[facility] Triage Call Report Triage Record Num: [id number] Operator: Altamese Mullin Patient Name: Mark Mckenzie Call Date & Time: 09/13/2012 7:14:46PM Patient Phone: [phone] PCP: Darryll Capers Patient Gender: Male PCP Fax : [phone] Patient DOB: 1990/09/22 Practice Name: [facility] Reason for Call: Caller: Margaret/Mother; PCP: Darryll Capers (Adults only); CB#: [phone]; Patient Weight: 175 lbs; Call regarding blisters; Onset 09/08/12 and 09/10/2012, Seen Tuesday the 24th and diagnosed with sinus infection was placed on Amoxicillin also given mouth wash for blisters located on his lips and inside his mouth. Temp 100.4 orally @ 1800. Triaged per Mouth Ulcers Guideline, See Provider within 24 hours Disposition for "Ulcers and sores also present on outer lips." Homecare parameters reviewed, Mom will contact office in am for an appt, Mom verbalized understanding. Protocol(s) Used: Fever Blisters (Cold Sores) (Pediatric) Protocol(s) Used: Mouth Ulcers (Pediatric) Recommended Outcome per Protocol: See Provider within 24 hours Reason for Outcome: Ulcers or sores also inside the lips or mouth Ulcers and sores also present on outer lips Care Advice: CALL BACK IF: - Your child becomes worse ~ SEE PHYSICIAN WITHIN 24 HOURS IF OFFICE WILL BE OPEN: Your child needs to be examined within the next 24 hours. Call your child's doctor when the office opens, and make an appointment. IF OFFICE WILL BE CLOSED: Your child needs to be examined within the next 24 hours. Go to _________ at your convenience. ~ 12/29/

## 2012-09-17 ENCOUNTER — Telehealth: Payer: Self-pay | Admitting: Internal Medicine

## 2012-09-17 LAB — WOUND CULTURE: Gram Stain: NONE SEEN

## 2012-09-17 NOTE — Telephone Encounter (Signed)
Pt mother is requesting MRSA blood work results

## 2012-09-17 NOTE — Telephone Encounter (Signed)
Left message on machine Nothing in culture and there are multiple otc drug meds,allegra, zyrtec ,claritin ,etc

## 2012-09-17 NOTE — Telephone Encounter (Signed)
Mom calling back to get the test results.  Was given the message from Dr. Caryl Never that it is not MRSA, staph or strep.   He has improved greatly on the Doxycycline and will complete the medication.  She is asking if there is a medication that he can take daily for his chronic sinus issues.  The Singular is very expensive.

## 2012-09-18 NOTE — Progress Notes (Signed)
Quick Note:  Pt informed ______ 

## 2012-10-30 ENCOUNTER — Ambulatory Visit: Payer: BC Managed Care – PPO | Admitting: Family Medicine

## 2012-12-04 ENCOUNTER — Encounter: Payer: Self-pay | Admitting: Family Medicine

## 2012-12-04 ENCOUNTER — Ambulatory Visit (INDEPENDENT_AMBULATORY_CARE_PROVIDER_SITE_OTHER): Payer: BC Managed Care – PPO | Admitting: Family Medicine

## 2012-12-04 VITALS — BP 120/90 | Temp 98.2°F

## 2012-12-04 DIAGNOSIS — M545 Low back pain: Secondary | ICD-10-CM

## 2012-12-04 NOTE — Patient Instructions (Addendum)

## 2012-12-04 NOTE — Progress Notes (Signed)
  Subjective:    Patient ID: Mark Mckenzie, male    DOB: Apr 08, 1991, 22 y.o.   MRN: [id number]  HPI Patient seen with low back pain. First noted last summer after doing some heavy workouts and heavy lifting No specific injury. Location is lower lumbar area and left side. Severity is mild. Achy quality. Occasionally feels stiff. Denies specific triggers. No associated radiculopathy symptoms, weakness, or numbness.  No prior history of back pain. No fever. No appetite or weight changes. Occasional anti-inflammatories but no regular medications. Still able to run and lift.  He has been trained in proper technique for lifting.  Past Medical History  Diagnosis Date  . Acne    No past surgical history on file.  reports that he has never smoked. He has never used smokeless tobacco. He reports that he does not drink alcohol or use illicit drugs. family history includes Depression in his other and Neurologic Disorder in his other. No Known Allergies    Review of Systems  Constitutional: Negative for fever, activity change, appetite change and unexpected weight change.  Respiratory: Negative for cough and shortness of breath.   Cardiovascular: Negative for chest pain and leg swelling.  Gastrointestinal: Negative for vomiting and abdominal pain.  Genitourinary: Negative for dysuria, hematuria and flank pain.  Musculoskeletal: Positive for back pain. Negative for joint swelling.  Neurological: Negative for weakness and numbness.       Objective:   Physical Exam  Constitutional: He appears well-developed and well-nourished.  Cardiovascular: Normal rate and regular rhythm.   Pulmonary/Chest: Effort normal and breath sounds normal. No respiratory distress. He has no wheezes. He has no rales.  Musculoskeletal:  Back reveals good range of motion with flexion and extension. No spinal tenderness. Palpable muscle tension left lower lumbar region. Straight leg raise is negative.   Neurological:  Full Strength lower extremities. Symmetric knee and ankle reflexes.          Assessment & Plan:  Left lower lumbar back pain. Suspect muscular origin. Nonfocal exam. We've recommended some stretches for low back and also work on hamstring flexibility. If no better over the next month consider formal physical therapy and plain x-rays given duration of symptoms and patient agrees with this plan. He is still in college at this time

## 2013-06-08 ENCOUNTER — Ambulatory Visit (INDEPENDENT_AMBULATORY_CARE_PROVIDER_SITE_OTHER): Payer: BC Managed Care – PPO | Admitting: Family Medicine

## 2013-06-08 ENCOUNTER — Encounter: Payer: Self-pay | Admitting: Family Medicine

## 2013-06-08 VITALS — BP 130/80 | HR 76 | Temp 98.1°F | Wt 185.0 lb

## 2013-06-08 DIAGNOSIS — L0201 Cutaneous abscess of face: Secondary | ICD-10-CM

## 2013-06-08 MED ORDER — DOXYCYCLINE HYCLATE 100 MG PO TABS: 100.0000 mg | ORAL_TABLET | Freq: Two times a day (BID) | ORAL | Status: DC

## 2013-06-08 MED ORDER — MUPIROCIN 2 % EX OINT: TOPICAL_OINTMENT | Freq: Three times a day (TID) | CUTANEOUS | Status: DC

## 2013-06-08 NOTE — Progress Notes (Signed)
  Subjective:    Patient ID: Mark Mckenzie, male    DOB: 01-14-1991, 22 y.o.   MRN: [id number]  HPI Here for recurrent cellulitis around the face. Several times a year he gets a similar picture with burning and redness of the skin, sometimes with pustule formation. He was treated successfully last December with Doxycycline and Bactroban. Now he has had the same thing around the right eye and cheek for the past 3 days. No fever.    Review of Systems  Constitutional: Negative.   Skin: Positive for rash.       Objective:   Physical Exam  Constitutional: He appears well-developed and well-nourished.  Eyes: Conjunctivae are normal.  Skin:  He has areas of scaly red skin above and below the right eye, also the right cheek          Assessment & Plan:  Recurrent cellulitis, possibly a staph infection. Treat again with Doxycycline and Bactroban ointment. He will see Dr. Karlyn Agee , his dermatologist, soon and I advised him to ask Dr. Yetta Barre about the possibility of getting on prophylactic antibiotics for 6-12 months to see if we can break this cycle.

## 2014-01-06 ENCOUNTER — Other Ambulatory Visit: Payer: BC Managed Care – PPO

## 2014-01-07 ENCOUNTER — Other Ambulatory Visit (INDEPENDENT_AMBULATORY_CARE_PROVIDER_SITE_OTHER): Payer: BC Managed Care – PPO

## 2014-01-07 DIAGNOSIS — Z0000 Encounter for general adult medical examination without abnormal findings: Secondary | ICD-10-CM

## 2014-01-07 LAB — HEPATIC FUNCTION PANEL
ALBUMIN: 5 g/dL (ref 3.5–5.2)
ALK PHOS: 78 U/L (ref 39–117)
ALT: 29 U/L (ref 0–53)
AST: 17 U/L (ref 0–37)
Bilirubin, Direct: 0.1 mg/dL (ref 0.0–0.3)
Total Bilirubin: 1.2 mg/dL (ref 0.3–1.2)
Total Protein: 7.8 g/dL (ref 6.0–8.3)

## 2014-01-07 LAB — CBC WITH DIFFERENTIAL/PLATELET
BASOS ABS: 0.1 10*3/uL (ref 0.0–0.1)
Basophils Relative: 0.8 % (ref 0.0–3.0)
EOS ABS: 0.1 10*3/uL (ref 0.0–0.7)
Eosinophils Relative: 1.6 % (ref 0.0–5.0)
HCT: 47.4 % (ref 39.0–52.0)
HEMOGLOBIN: 16 g/dL (ref 13.0–17.0)
LYMPHS PCT: 41.9 % (ref 12.0–46.0)
Lymphs Abs: 3.3 10*3/uL (ref 0.7–4.0)
MCHC: 33.9 g/dL (ref 30.0–36.0)
MCV: 88.1 fl (ref 78.0–100.0)
MONO ABS: 0.5 10*3/uL (ref 0.1–1.0)
Monocytes Relative: 6.4 % (ref 3.0–12.0)
NEUTROS ABS: 3.9 10*3/uL (ref 1.4–7.7)
Neutrophils Relative %: 49.3 % (ref 43.0–77.0)
Platelets: 297 10*3/uL (ref 150.0–400.0)
RBC: 5.38 Mil/uL (ref 4.22–5.81)
RDW: 12.3 % (ref 11.5–14.6)
WBC: 7.8 10*3/uL (ref 4.5–10.5)

## 2014-01-07 LAB — POCT URINALYSIS DIPSTICK
Bilirubin, UA: NEGATIVE
Blood, UA: NEGATIVE
GLUCOSE UA: NEGATIVE
Ketones, UA: NEGATIVE
LEUKOCYTES UA: NEGATIVE
NITRITE UA: NEGATIVE
PROTEIN UA: NEGATIVE
SPEC GRAV UA: 1.015
UROBILINOGEN UA: 0.2
pH, UA: 7

## 2014-01-07 LAB — LIPID PANEL
CHOLESTEROL: 199 mg/dL (ref 0–200)
HDL: 47.6 mg/dL (ref >39.00)
LDL Cholesterol: 140 mg/dL — ABNORMAL HIGH (ref 0–99)
TRIGLYCERIDES: 58 mg/dL (ref 0.0–149.0)
Total CHOL/HDL Ratio: 4
VLDL: 11.6 mg/dL (ref 0.0–40.0)

## 2014-01-07 LAB — BASIC METABOLIC PANEL
BUN: 17 mg/dL (ref 6–23)
CALCIUM: 10.1 mg/dL (ref 8.4–10.5)
CO2: 27 meq/L (ref 19–32)
CREATININE: 1 mg/dL (ref 0.4–1.5)
Chloride: 102 mEq/L (ref 96–112)
GFR: 98.5 mL/min (ref >60.00)
GLUCOSE: 94 mg/dL (ref 70–99)
Potassium: 4.4 mEq/L (ref 3.5–5.1)
SODIUM: 138 meq/L (ref 135–145)

## 2014-01-07 LAB — TSH: TSH: 1.3 u[IU]/mL (ref 0.35–5.50)

## 2014-01-17 ENCOUNTER — Ambulatory Visit (INDEPENDENT_AMBULATORY_CARE_PROVIDER_SITE_OTHER): Payer: BC Managed Care – PPO | Admitting: Family Medicine

## 2014-01-17 ENCOUNTER — Encounter: Payer: Self-pay | Admitting: Family Medicine

## 2014-01-17 VITALS — BP 120/80 | HR 74 | Temp 98.1°F | Ht 72.0 in | Wt 192.0 lb

## 2014-01-17 DIAGNOSIS — Z0000 Encounter for general adult medical examination without abnormal findings: Secondary | ICD-10-CM

## 2014-01-17 NOTE — Progress Notes (Signed)
Pre visit review using our clinic review tool, if applicable. No additional management support is needed unless otherwise documented below in the visit note. 

## 2014-01-17 NOTE — Progress Notes (Signed)
   Subjective:    Patient ID: Mark Mckenzie, male    DOB: 09/13/1991, 23 y.o.   MRN: [id number]  HPI Patient seen for complete physical. Generally very healthy. He exercises diligently several days per week. Takes no regular prescription medications. His tetanus up-to-date. He has upcoming trip to [place].  He plans to visit health Department for immunizations. Nonsmoker. No illicit drug use. Graduated from [employer] last year with degree and global studies. 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  Past Medical History  Diagnosis Date  . Acne    No past surgical history on file.  reports that he has never smoked. He has never used smokeless tobacco. He reports that he drinks alcohol. He reports that he does not use illicit drugs. family history includes Depression in his other; Diabetes in his father; Neurologic Disorder in his other. No Known Allergies    Review of Systems  Constitutional: Negative for fever, activity change, appetite change and fatigue.  HENT: Negative for congestion, ear pain and trouble swallowing.   Eyes: Negative for pain and visual disturbance.  Respiratory: Negative for cough, shortness of breath and wheezing.   Cardiovascular: Negative for chest pain and palpitations.  Gastrointestinal: Negative for nausea, vomiting, abdominal pain, diarrhea, constipation, blood in stool, abdominal distention and rectal pain.  Endocrine: Negative for polydipsia and polyuria.  Genitourinary: Negative for dysuria, hematuria and testicular pain.  Musculoskeletal: Negative for arthralgias and joint swelling.  Skin: Negative for rash.  Neurological: Negative for dizziness, syncope and headaches.  Hematological: Negative for adenopathy.  Psychiatric/Behavioral: Negative for confusion and dysphoric mood.       Objective:   Physical Exam  Constitutional: He is oriented to person, place, and time. He appears well-developed and well-nourished. No distress.  HENT:  Head:  Normocephalic and atraumatic.  Right Ear: External ear normal.  Left Ear: External ear normal.  Mouth/Throat: Oropharynx is clear and moist.  Eyes: Conjunctivae and EOM are normal. Pupils are equal, round, and reactive to light.  Neck: Normal range of motion. Neck supple. No thyromegaly present.  Cardiovascular: Normal rate, regular rhythm and normal heart sounds.   No murmur heard. Pulmonary/Chest: No respiratory distress. He has no wheezes. He has no rales.  Abdominal: Soft. Bowel sounds are normal. He exhibits no distension and no mass. There is no tenderness. There is no rebound and no guarding.  Musculoskeletal: He exhibits no edema.  Lymphadenopathy:    He has no cervical adenopathy.  Neurological: He is alert and oriented to person, place, and time. He displays normal reflexes. No cranial nerve deficit.  Skin: No rash noted.  Psychiatric: He has a normal mood and affect.          Assessment & Plan:  Complete physical. Healthy 23 year old male. Labs reviewed with no major concerns. Continue regular exercise habits. He will be looking at health Department for immunizations for upcoming travel. We've recommended things like Zofran as needed for any nausea issues

## 2014-01-19 ENCOUNTER — Telehealth: Payer: Self-pay | Admitting: Internal Medicine

## 2014-01-19 ENCOUNTER — Ambulatory Visit (INDEPENDENT_AMBULATORY_CARE_PROVIDER_SITE_OTHER): Payer: BC Managed Care – PPO | Admitting: Family Medicine

## 2014-01-19 DIAGNOSIS — Z23 Encounter for immunization: Secondary | ICD-10-CM

## 2014-01-19 NOTE — Telephone Encounter (Signed)
Pt would like to switch to doc burchette. Can I sch?

## 2014-01-19 NOTE — Telephone Encounter (Signed)
yes

## 2014-01-20 NOTE — Telephone Encounter (Signed)
Lm with mom to have son callback

## 2014-01-20 NOTE — Telephone Encounter (Signed)
Pt stated he was seen this week and when he is ready to make another appt he will call back and schedule with dr.burchette.

## 2014-03-27 ENCOUNTER — Encounter (HOSPITAL_COMMUNITY): Payer: Self-pay | Admitting: Emergency Medicine

## 2014-03-27 ENCOUNTER — Emergency Department (HOSPITAL_COMMUNITY)
Admission: EM | Admit: 2014-03-27 | Discharge: 2014-03-27 | Disposition: A | Discharge status: Home / Self Care | Payer: BC Managed Care – PPO | Attending: Emergency Medicine | Admitting: Emergency Medicine

## 2014-03-27 ENCOUNTER — Emergency Department (HOSPITAL_COMMUNITY): Payer: BC Managed Care – PPO

## 2014-03-27 DIAGNOSIS — F411 Generalized anxiety disorder: Secondary | ICD-10-CM | POA: Insufficient documentation

## 2014-03-27 DIAGNOSIS — R011 Cardiac murmur, unspecified: Secondary | ICD-10-CM | POA: Insufficient documentation

## 2014-03-27 DIAGNOSIS — F419 Anxiety disorder, unspecified: Secondary | ICD-10-CM

## 2014-03-27 DIAGNOSIS — R42 Dizziness and giddiness: Secondary | ICD-10-CM | POA: Insufficient documentation

## 2014-03-27 LAB — COMPREHENSIVE METABOLIC PANEL
ALBUMIN: 4.6 g/dL (ref 3.5–5.2)
ALK PHOS: 87 U/L (ref 39–117)
ALT: 20 U/L (ref 0–53)
ANION GAP: 14 (ref 5–15)
AST: 15 U/L (ref 0–37)
BILIRUBIN TOTAL: 0.3 mg/dL (ref 0.3–1.2)
BUN: 19 mg/dL (ref 6–23)
CHLORIDE: 103 meq/L (ref 96–112)
CO2: 26 meq/L (ref 19–32)
CREATININE: 1.16 mg/dL (ref 0.50–1.35)
Calcium: 9.7 mg/dL (ref 8.4–10.5)
GFR calc Af Amer: >90 mL/min (ref >90)
GFR, EST NON AFRICAN AMERICAN: 88 mL/min — AB (ref >90)
Glucose, Bld: 145 mg/dL — ABNORMAL HIGH (ref 70–99)
POTASSIUM: 3.7 meq/L (ref 3.7–5.3)
Sodium: 143 mEq/L (ref 137–147)
Total Protein: 7.8 g/dL (ref 6.0–8.3)

## 2014-03-27 LAB — RAPID URINE DRUG SCREEN, HOSP PERFORMED
AMPHETAMINES: NOT DETECTED
Barbiturates: NOT DETECTED
Benzodiazepines: NOT DETECTED
Cocaine: NOT DETECTED
OPIATES: NOT DETECTED
TETRAHYDROCANNABINOL: NOT DETECTED

## 2014-03-27 LAB — CBC
HEMATOCRIT: 45.6 % (ref 39.0–52.0)
HEMOGLOBIN: 15.9 g/dL (ref 13.0–17.0)
MCH: 29.1 pg (ref 26.0–34.0)
MCHC: 34.9 g/dL (ref 30.0–36.0)
MCV: 83.4 fL (ref 78.0–100.0)
Platelets: 302 10*3/uL (ref 150–400)
RBC: 5.47 MIL/uL (ref 4.22–5.81)
RDW: 11.5 % (ref 11.5–15.5)
WBC: 9.4 10*3/uL (ref 4.0–10.5)

## 2014-03-27 LAB — I-STAT TROPONIN, ED: Troponin i, poc: 0 ng/mL (ref 0.00–0.08)

## 2014-03-27 MED ORDER — LORAZEPAM 1 MG PO TABS
1.0000 mg | ORAL_TABLET | Freq: Once | ORAL | Status: DC
  Filled 2014-03-27: qty 1

## 2014-03-27 NOTE — Discharge Instructions (Signed)
Please followup with your doctor for continued evaluation and treatment.   Panic Attacks Panic attacks are sudden, short-livedsurges of severe anxiety, fear, or discomfort. They may occur for no reason when you are relaxed, when you are anxious, or when you are sleeping. Panic attacks may occur for a number of reasons:   Healthy people occasionally have panic attacks in extreme, life-threatening situations, such as war or natural disasters. Normal anxiety is a protective mechanism of the body that helps [place] react to danger (fight or flight response).  Panic attacks are often seen with anxiety disorders, such as panic disorder, social anxiety disorder, generalized anxiety disorder, and phobias. Anxiety disorders cause excessive or uncontrollable anxiety. They may interfere with your relationships or other life activities.  Panic attacks are sometimes seen with other mental illnesses, such as depression and posttraumatic stress disorder.  Certain medical conditions, prescription medicines, and drugs of abuse can cause panic attacks. SYMPTOMS  Panic attacks start suddenly, peak within 20 minutes, and are accompanied by four or more of the following symptoms:  Pounding heart or fast heart rate (palpitations).  Sweating.  Trembling or shaking.  Shortness of breath or feeling smothered.  Feeling choked.  Chest pain or discomfort.  Nausea or strange feeling in your stomach.  Dizziness, light-headedness, or feeling like you will faint.  Chills or hot flushes.  Numbness or tingling in your lips or hands and feet.  Feeling that things are not real or feeling that you are not yourself.  Fear of losing control or going crazy.  Fear of dying. Some of these symptoms can mimic serious medical conditions. For example, you may think you are having a heart attack. Although panic attacks can be very scary, they are not life threatening. DIAGNOSIS  Panic attacks are diagnosed through an  assessment by your health care provider. Your health care provider will ask questions about your symptoms, such as where and when they occurred. Your health care provider will also ask about your medical history and use of alcohol and drugs, including prescription medicines. Your health care provider may order blood tests or other studies to rule out a serious medical condition. Your health care provider may refer you to a mental health professional for further evaluation. TREATMENT   Most healthy people who have one or two panic attacks in an extreme, life-threatening situation will not require treatment.  The treatment for panic attacks associated with anxiety disorders or other mental illness typically involves counseling with a mental health professional, medicine, or a combination of both. Your health care provider will help determine what treatment is best for you.  Panic attacks due to physical illness usually go away with treatment of the illness. If prescription medicine is causing panic attacks, talk with your health care provider about stopping the medicine, decreasing the dose, or substituting another medicine.  Panic attacks due to alcohol or drug abuse go away with abstinence. Some adults need professional help in order to stop drinking or using drugs. HOME CARE INSTRUCTIONS   Take all medicines as directed by your health care provider.   Schedule and attend follow-up visits as directed by your health care provider. It is important to keep all your appointments. SEEK MEDICAL CARE IF:  You are not able to take your medicines as prescribed.  Your symptoms do not improve or get worse. SEEK IMMEDIATE MEDICAL CARE IF:   You experience panic attack symptoms that are different than your usual symptoms.  You have serious thoughts about hurting  yourself or others.  You are taking medicine for panic attacks and have a serious side effect. MAKE SURE YOU:  Understand these  instructions.  Will watch your condition.  Will get help right away if you are not doing well or get worse. Document Released: 09/02/2005 Document Revised: 09/07/2013 Document Reviewed: 04/16/2013 [facility] Patient Information 2015 [employer] [place]. This information is not intended to replace advice given to you by your health care provider. Make sure you discuss any questions you have with your health care provider.

## 2014-03-27 NOTE — ED Notes (Signed)
Pt c/o palpitation onset 0200, pt states episodes usually subside but the episode tonight returns. Pt states he has attributed these episodes to anxiety in the past. Pt states tonight his head became "fuzzy, like I was going to pass out"

## 2014-03-27 NOTE — ED Provider Notes (Signed)
CSN: [id number]     Arrival date & time 03/27/14  0226 History   First MD Initiated Contact with Patient 03/27/14 [phone]     Chief Complaint  Patient presents with  . Palpitations   HPI  History provided by the patient. Patient is a 23 year old male with no significant PMH who presents with complaints of heart palpitations, chest discomfort and lightheadedness. Patient states that he was restless trying to sleep at around 2 AM started to feel uncomfortable chest palpitations. He tried to calm himself down and relax thinking this was just anxiety. This helped slightly but symptoms returned. He also felt some tightness in the chest. He became slightly nauseous and had used the bathroom and while having a bowel movement felt very lightheaded. He also felt lightheaded when he stood up with near syncope. No actual syncopal episode. Since coming to the emergency room he has been able to relax and feel improved. He reports having similar symptoms in the past which always seem to occur when he is laying down trying to sleep. He has usually been able to calm himself and symptoms resolved. He does report some increased stress with the recent move and relationship issues with girlfriend. Patient also has [place]. He was doing very well since returning home. He has not had any pain or swelling in his extremities. Denies any pleuritic pains. No cough or hemoptysis. No recent fever chills or sweats. He is taking doxycycline as malaria preventative treatment.    Past Medical History  Diagnosis Date  . Acne    History reviewed. No pertinent past surgical history. Family History  Problem Relation Age of Onset  . Depression Other   . Neurologic Disorder Other   . Diabetes Father     type 2   History  Substance Use Topics  . Smoking status: Never Smoker   . Smokeless tobacco: Never Used  . Alcohol Use: Yes     Comment: occ    Review of Systems  Constitutional:  Negative for fever.  Respiratory: Negative for shortness of breath.   Cardiovascular: Positive for palpitations.  Neurological: Positive for light-headedness. Negative for syncope and headaches.  All other systems reviewed and are negative.     Allergies  Review of patient's allergies indicates no known allergies.  Home Medications   Prior to Admission medications   Medication Sig Start Date End Date Taking? Authorizing Provider  acyclovir (ZOVIRAX) 800 MG tablet Take 800 mg by mouth 3 (three) times daily as needed (for outbreaks).  11/21/12  Yes Historical Provider, MD  doxycycline (VIBRA-TABS) 100 MG tablet Take 100 mg by mouth daily. 30 day therapy course patient has 2 weeks remaining   Yes Historical Provider, MD  ibuprofen (ADVIL,MOTRIN) 200 MG tablet Take 400-600 mg by mouth every 6 (six) hours as needed (for pain).   Yes Historical Provider, MD   BP 131/68  Pulse 61  Temp(Src) 98.2 F (36.8 C) (Oral)  Resp 18  Ht 6\' 2"  (1.88 m)  Wt 178 lb (80.74 kg)  BMI 22.84 kg/m2  SpO2 100% Physical Exam  Nursing note and vitals reviewed. Constitutional: He appears well-developed and well-nourished. No distress.  HENT:  Head: Normocephalic.  Cardiovascular: Normal rate and regular rhythm.   Murmur heard. Pulmonary/Chest: Effort normal and breath sounds normal. No respiratory distress. He has no wheezes. He has no rales.  Abdominal: Soft.  Musculoskeletal: Normal range of motion.  Neurological: He is alert.  Skin: Skin is warm.  Psychiatric: He has a normal mood and affect. His behavior is normal.    ED Course  Procedures   COORDINATION OF CARE:  Nursing notes reviewed. Vital signs reviewed. Initial pt interview and examination performed.   Filed Vitals:   03/27/14 0321  BP: 131/68  Pulse: 61  Temp: 98.2 F (36.8 C)  TempSrc: Oral  Resp: 18  Height: 6\' 2"  (1.88 m)  Weight: 178 lb (80.74 kg)  SpO2: 100%    5:58 AM-patient seen and evaluated. Patient appears well  does have an slight nervous appearing tremor.  Patient also seen and discussed with attending physician. At this time no acute or concerning symptoms. Patient does not have any clinical signs for PE or DVT. Doubt any ACS. Patient has had cardiology followup in the past with echocardiogram that was normal. At this time we'll recommend PCP followup. Patient agrees with plan.     Treatment plan initiated: Medications  LORazepam (ATIVAN) tablet 1 mg (1 mg Oral Not Given 03/27/14 0549)    Results for orders placed during the hospital encounter of 03/27/14  CBC      Result Value Ref Range   WBC 9.4  4.0 - 10.5 K/uL   RBC 5.47  4.22 - 5.81 MIL/uL   Hemoglobin 15.9  13.0 - 17.0 g/dL   HCT 16.1  09.6 - 04.5 %   MCV 83.4  78.0 - 100.0 fL   MCH 29.1  26.0 - 34.0 pg   MCHC 34.9  30.0 - 36.0 g/dL   RDW 40.9  81.1 - 91.4 %   Platelets 302  150 - 400 K/uL  COMPREHENSIVE METABOLIC PANEL      Result Value Ref Range   Sodium 143  137 - 147 mEq/L   Potassium 3.7  3.7 - 5.3 mEq/L   Chloride 103  96 - 112 mEq/L   CO2 26  19 - 32 mEq/L   Glucose, Bld 145 (*) 70 - 99 mg/dL   BUN 19  6 - 23 mg/dL   Creatinine, Ser 7.82  0.50 - 1.35 mg/dL   Calcium 9.7  8.4 - 95.6 mg/dL   Total Protein 7.8  6.0 - 8.3 g/dL   Albumin 4.6  3.5 - 5.2 g/dL   AST 15  0 - 37 U/L   ALT 20  0 - 53 U/L   Alkaline Phosphatase 87  39 - 117 U/L   Total Bilirubin 0.3  0.3 - 1.2 mg/dL   GFR calc non Af Amer 88 (*) >90 mL/min   GFR calc Af Amer >90  >90 mL/min   Anion gap 14  5 - 15  URINE RAPID DRUG SCREEN (HOSP PERFORMED)      Result Value Ref Range   Opiates NONE DETECTED  NONE DETECTED   Cocaine NONE DETECTED  NONE DETECTED   Benzodiazepines NONE DETECTED  NONE DETECTED   Amphetamines NONE DETECTED  NONE DETECTED   Tetrahydrocannabinol NONE DETECTED  NONE DETECTED   Barbiturates NONE DETECTED  NONE DETECTED  I-STAT TROPOININ, ED      Result Value Ref Range   Troponin i, poc 0.00  0.00 - 0.08 ng/mL   Comment 3                Imaging Review Dg Chest Port 1 View  03/27/2014   CLINICAL DATA:  Palpitations and nervousness.  EXAM: PORTABLE CHEST - 1 VIEW  COMPARISON:  None.  FINDINGS: The heart size and mediastinal contours  are within normal limits. Both lungs are clear. The visualized skeletal structures are unremarkable.  IMPRESSION: No active disease.   Electronically Signed   By: Burman NievesWilliam  Stevens M.D.   On: 03/27/2014 04:23     EKG Interpretation   Date/Time:  Sunday March 27 2014 03:55:51 EDT Ventricular Rate:  62 PR Interval:  144 QRS Duration: 100 QT Interval:  413 QTC Calculation: 419 R Axis:   88 Text Interpretation:  Sinus rhythm Probable left ventricular hypertrophy  Probable inferior infarct, old Anterolateral Q wave, probably normal for  age Anterior ST elevation, probably due to LVH No old tracing to compare  Confirmed by OTTER  MD, OLGA ([id number]) on 03/27/2014 5:54:35 AM      MDM   Final diagnoses:  Anxiety        Angus SellerPeter S Mlissa Tamayo, PA-C 03/27/14 2037

## 2014-03-28 NOTE — ED Provider Notes (Signed)
Medical screening examination/treatment/procedure(s) were performed by non-physician practitioner and as supervising physician I was immediately available for consultation/collaboration.   EKG Interpretation   Date/Time:  Sunday March 27 2014 03:55:51 EDT Ventricular Rate:  62 PR Interval:  144 QRS Duration: 100 QT Interval:  413 QTC Calculation: 419 R Axis:   88 Text Interpretation:  Sinus rhythm Probable left ventricular hypertrophy  Probable inferior infarct, old Anterolateral Q wave, probably normal for  age Anterior ST elevation, probably due to LVH No old tracing to compare  Confirmed by Reneshia Zuccaro  MD, Jacoria Keiffer ([id number]) on 03/27/2014 5:54:35 AM       Olivia Mackielga M Larkin Alfred, MD 03/28/14 [phone]

## 2014-04-14 ENCOUNTER — Encounter: Payer: Self-pay | Admitting: Family Medicine

## 2014-04-14 ENCOUNTER — Ambulatory Visit (INDEPENDENT_AMBULATORY_CARE_PROVIDER_SITE_OTHER): Payer: BC Managed Care – PPO | Admitting: Family Medicine

## 2014-04-14 VITALS — BP 130/80 | HR 91 | Temp 98.5°F | Wt 183.0 lb

## 2014-04-14 DIAGNOSIS — J069 Acute upper respiratory infection, unspecified: Secondary | ICD-10-CM

## 2014-04-14 DIAGNOSIS — F410 Panic disorder [episodic paroxysmal anxiety] without agoraphobia: Secondary | ICD-10-CM

## 2014-04-14 NOTE — Patient Instructions (Signed)

## 2014-04-14 NOTE — Progress Notes (Signed)
Pre visit review using our clinic review tool, if applicable. No additional management support is needed unless otherwise documented below in the visit note. 

## 2014-04-14 NOTE — Progress Notes (Signed)
   Subjective:    Patient ID: Mark Mckenzie, Mark Mckenzie    DOB: 01/21/1991, 23 y.o.   MRN: [id number]  Sinusitis Associated symptoms include congestion, headaches and a sore throat. Pertinent negatives include no chills, shortness of breath or sinus pressure.   Acute visit. Sinus congestion past couple days. Had some greenish nasal discharge. No cough. Intermittent headaches. Mild sore throat. Has taken over-the-counter medications with moderate relief. No fever, nausea, or vomiting.  Patient had recent visit to emergency room with atypical chest pain and dyspnea. This occurred at night. He describes a least a few episodes which have waken up from sleep with shortness of breath, anxiousness, feeling of impending doom and chest pain. No exertional symptoms. No clear provoking factors.  Symptoms usually last about 15-20 minutes.  Past Medical History  Diagnosis Date  . Acne    No past surgical history on file.  reports that he has never smoked. He has never used smokeless tobacco. He reports that he drinks alcohol. He reports that he does not use illicit drugs. family history includes Depression in his other; Diabetes in his father; Neurologic Disorder in his other. No Known Allergies    Review of Systems  Constitutional: Negative for fever, chills, appetite change and unexpected weight change.  HENT: Positive for congestion and sore throat. Negative for sinus pressure and voice change.   Respiratory: Negative for shortness of breath.   Cardiovascular: Negative for chest pain.  Neurological: Positive for headaches.  Hematological: Negative for adenopathy.       Objective:   Physical Exam  Constitutional: He appears well-developed and well-nourished. No distress.  HENT:  Right Ear: External ear normal.  Left Ear: External ear normal.  Mouth/Throat: Oropharynx is clear and moist.  Neck: Neck supple.  Cardiovascular: Normal rate and regular rhythm.   Pulmonary/Chest: Effort normal  and breath sounds normal. No respiratory distress. He has no wheezes. He has no rales.  Lymphadenopathy:    He has no cervical adenopathy.          Assessment & Plan:  #1 URI. Suspect viral. Reassurance and over-the-counter medications as needed for symptom relief. #2 intermittent acute episodes of dyspnea and chest pain. Strongly suspect panic disorder. We discussed possible prophylaxis with medication such as Zoloft and at this point wishes to observe. Recent ED notes reviewed.

## 2014-07-04 ENCOUNTER — Encounter: Payer: Self-pay | Admitting: Family Medicine

## 2014-07-04 ENCOUNTER — Ambulatory Visit (INDEPENDENT_AMBULATORY_CARE_PROVIDER_SITE_OTHER): Payer: BC Managed Care – PPO | Admitting: Family Medicine

## 2014-07-04 VITALS — BP 122/70 | HR 100 | Temp 99.0°F | Ht 74.0 in | Wt 184.0 lb

## 2014-07-04 DIAGNOSIS — J209 Acute bronchitis, unspecified: Secondary | ICD-10-CM

## 2014-07-04 MED ORDER — AZITHROMYCIN 250 MG PO TABS: ORAL_TABLET | ORAL | Status: DC

## 2014-07-04 NOTE — Progress Notes (Signed)
   Subjective:    Patient ID: Mark LintJonathan C Mckenzie, male    DOB: 08/02/1991, [age] y.o.   MRN: [id number]  HPI Here for one week of PND, chest congestion and coughing up green sputum.    Review of Systems  Constitutional: Negative.   HENT: Positive for congestion and postnasal drip. Negative for sinus pressure.   Eyes: Negative.   Respiratory: Positive for cough.        Objective:   Physical Exam  Constitutional: He appears well-developed and well-nourished.  HENT:  Right Ear: External ear normal.  Left Ear: External ear normal.  Nose: Nose normal.  Mouth/Throat: Oropharynx is clear and moist.  Eyes: Conjunctivae are normal.  Pulmonary/Chest: Effort normal and breath sounds normal.  Lymphadenopathy:    He has no cervical adenopathy.          Assessment & Plan:  Add Mucinex. Written out of work today.

## 2014-07-04 NOTE — Progress Notes (Signed)
Pre visit review using our clinic review tool, if applicable. No additional management support is needed unless otherwise documented below in the visit note. 

## 2014-07-13 ENCOUNTER — Ambulatory Visit (INDEPENDENT_AMBULATORY_CARE_PROVIDER_SITE_OTHER): Payer: BC Managed Care – PPO | Admitting: Family Medicine

## 2014-07-13 ENCOUNTER — Encounter: Payer: Self-pay | Admitting: Family Medicine

## 2014-07-13 VITALS — BP 115/72 | HR 74 | Temp 98.7°F | Wt 183.0 lb

## 2014-07-13 DIAGNOSIS — J0140 Acute pansinusitis, unspecified: Secondary | ICD-10-CM

## 2014-07-13 MED ORDER — AMOXICILLIN-POT CLAVULANATE 875-125 MG PO TABS: 1.0000 | ORAL_TABLET | Freq: Two times a day (BID) | ORAL | Status: DC

## 2014-07-13 NOTE — Progress Notes (Signed)
   Subjective:    Patient ID: Mark Mckenzie, male    DOB: 1990-12-09, 23 y.o.   MRN: [id number]  Cough Pertinent negatives include no chills or fever.   Patient is seen with some persistent cough and persistent sinus congestion. He was seen over week ago and placed on Zithromax. His cough became less productive but he has had some colored nasal discharge and bilateral maxillary and frontal sinus pressure. Only occasional mild headache. No fevers or chills. He's had increased malaise. He's tried over-the-counter medications without improvement. Nonsmoker.  Past Medical History  Diagnosis Date  . Acne    No past surgical history on file.  reports that he has never smoked. He has never used smokeless tobacco. He reports that he drinks alcohol. He reports that he does not use illicit drugs. family history includes Depression in his other; Diabetes in his father; Neurologic Disorder in his other. No Known Allergies    Review of Systems  Constitutional: Positive for fatigue. Negative for fever and chills.  HENT: Positive for congestion and sinus pressure.   Respiratory: Positive for cough.   Neurological: Negative for dizziness.       Objective:   Physical Exam  Constitutional: He appears well-developed and well-nourished.  HENT:  Right Ear: External ear normal.  Left Ear: External ear normal.  Mouth/Throat: Oropharynx is clear and moist.  Neck: Neck supple.  Cardiovascular: Normal rate and regular rhythm.   Pulmonary/Chest: Effort normal and breath sounds normal. No respiratory distress. He has no wheezes. He has no rales.  Lymphadenopathy:    He has no cervical adenopathy.          Assessment & Plan:  Persistent pansinusitis symptoms. Augmentin 875 mg twice daily for 10 days. Touch base if not improved following that

## 2014-07-13 NOTE — Patient Instructions (Signed)

## 2014-07-13 NOTE — Progress Notes (Signed)
Pre visit review using our clinic review tool, if applicable. No additional management support is needed unless otherwise documented below in the visit note. 

## 2014-07-15 ENCOUNTER — Telehealth: Payer: Self-pay | Admitting: Family Medicine

## 2014-07-15 MED ORDER — ACYCLOVIR 800 MG PO TABS: 800.0000 mg | ORAL_TABLET | Freq: Three times a day (TID) | ORAL | Status: DC | PRN

## 2014-07-15 NOTE — Telephone Encounter (Signed)
Pt request refill of the following: acyclovir (ZOVIRAX) 800 MG tablet    Phamacy: [facility]

## 2014-07-15 NOTE — Telephone Encounter (Signed)
Rx sent to pharmacy   

## 2014-07-15 NOTE — Telephone Encounter (Signed)
Mom states that pt needs this med and was told it would be 3 days. Pt is begining to have this issue w/ his face and cannot wait 3 days. Pt is trying to do his own refills and will remember to call earlier next time.  She thought he may have spoke w. Bonita QuinYou. I assured her he did not.  He said whoever it was was not sympathic. Thanks

## 2014-10-28 ENCOUNTER — Ambulatory Visit (INDEPENDENT_AMBULATORY_CARE_PROVIDER_SITE_OTHER): Payer: BLUE CROSS/BLUE SHIELD | Admitting: Family Medicine

## 2014-10-28 ENCOUNTER — Encounter: Payer: Self-pay | Admitting: Family Medicine

## 2014-10-28 VITALS — BP 110/70 | HR 76 | Temp 99.2°F | Wt 177.0 lb

## 2014-10-28 DIAGNOSIS — R300 Dysuria: Secondary | ICD-10-CM

## 2014-10-28 DIAGNOSIS — A084 Viral intestinal infection, unspecified: Secondary | ICD-10-CM

## 2014-10-28 LAB — POCT URINALYSIS DIPSTICK
Blood, UA: NEGATIVE
Glucose, UA: NEGATIVE
Leukocytes, UA: NEGATIVE
Nitrite, UA: NEGATIVE
Protein, UA: NEGATIVE
Spec Grav, UA: 1.015
Urobilinogen, UA: 0.2
pH, UA: 6

## 2014-10-28 MED ORDER — ONDANSETRON 8 MG PO TBDP: 8.0000 mg | ORAL_TABLET | Freq: Three times a day (TID) | ORAL | Status: DC | PRN

## 2014-10-28 NOTE — Progress Notes (Signed)
Pre visit review using our clinic review tool, if applicable. No additional management support is needed unless otherwise documented below in the visit note. 

## 2014-10-28 NOTE — Progress Notes (Signed)
   Subjective:    Patient ID: Mark Mckenzie, male    DOB: [birth year], 24 y.o.   MRN: [id number]  HPI Patient seen with onset 3 AM today of vomiting and diarrhea. He's had several episodes nonbloody diarrhea. His vomiting has reduced over the past couple hours. He's had some diffuse abdominal cramping which has also improved over the past couple hours. Increased malaise. Diffuse body aches. Denies any fever or chills. He does describe having burning with urination a couple episodes earlier today. No sick contacts.  Past Medical History  Diagnosis Date  . Acne    No past surgical history on file.  reports that he has never smoked. He has never used smokeless tobacco. He reports that he drinks alcohol. He reports that he does not use illicit drugs. family history includes Depression in his other; Diabetes in his father; Neurologic Disorder in his other. No Known Allergies    Review of Systems  Constitutional: Positive for appetite change and fatigue. Negative for fever and chills.  Respiratory: Negative for shortness of breath.   Gastrointestinal: Positive for nausea, vomiting, abdominal pain and diarrhea. Negative for blood in stool.  Neurological: Positive for headaches. Negative for dizziness.       Objective:   Physical Exam  Constitutional: He appears well-developed and well-nourished. No distress.  HENT:  Mouth/Throat: Oropharynx is clear and moist.  Cardiovascular: Normal rate and regular rhythm.   Pulmonary/Chest: Effort normal and breath sounds normal. No respiratory distress. He has no wheezes. He has no rales.  Abdominal: Soft. Bowel sounds are normal. He exhibits no distension and no mass. There is no tenderness. There is no rebound and no guarding.          Assessment & Plan:  Probable viral gastroenteritis. Symptomatically improving. Zofran 8 mg every 8 hours when necessary nausea and vomiting. Advance diet gradually as tolerated. Check urinalysis with mild  burning earlier today  Urine dipstick shows ketones but no evidence for infection. Increase hydration

## 2015-08-13 ENCOUNTER — Other Ambulatory Visit: Payer: Self-pay | Admitting: Family Medicine

## 2015-08-22 ENCOUNTER — Telehealth: Payer: Self-pay | Admitting: Family Medicine

## 2015-08-22 NOTE — Telephone Encounter (Signed)
Mr. Mark Mckenzie called saying he's in school at [employer] and saw a counselor today who suggested that due to his anxiety, he be placed on medication. He's wondering if [name] will prescribe Lexapro for him. He'd like a phone call regarding this.  Pt ph# [phone] Thank you.

## 2015-08-22 NOTE — Telephone Encounter (Signed)
Pt has been notified that he needs an appt. His insurance will not allow him to come here so he will have the medication Rxed through the school. He plans on following up when he has regular insurance.

## 2015-08-29 ENCOUNTER — Encounter: Payer: Self-pay | Admitting: Family Medicine

## 2015-08-29 ENCOUNTER — Ambulatory Visit (INDEPENDENT_AMBULATORY_CARE_PROVIDER_SITE_OTHER): Payer: BLUE CROSS/BLUE SHIELD | Admitting: Family Medicine

## 2015-08-29 VITALS — BP 130/90 | HR 90 | Temp 98.3°F | Resp 14 | Ht 74.0 in | Wt 187.1 lb

## 2015-08-29 DIAGNOSIS — F411 Generalized anxiety disorder: Secondary | ICD-10-CM

## 2015-08-29 MED ORDER — SERTRALINE HCL 50 MG PO TABS: 50.0000 mg | ORAL_TABLET | Freq: Every day | ORAL | Status: DC

## 2015-08-29 NOTE — Progress Notes (Signed)
Pre visit review using our clinic review tool, if applicable. No additional management support is needed unless otherwise documented below in the visit note. 

## 2015-08-29 NOTE — Progress Notes (Signed)
   Subjective:    Patient ID: Mark Mckenzie, male    DOB: 1990/11/08, 24 y.o.   MRN: [id number]  HPI Here to discuss anxiety issues. He's had some issues going back to high school. Presumed panic attacks. Frequently has episodes that come on without clear provocation. He has multiple physical symptoms including sensation of tingling, occasional chest pains, lightheadedness, palpitations. These usually pass after several minutes.  He is aware that he tends to over focus on his physical symptoms. He had extensive counseling in the past. [facility] Minimal caffeine use. No regular prescription medications. Does not take any over-the-counter medications. Denies depression. No significant situational stressors. No history of social anxiety or OCD. 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 Acne    No past surgical history on file.  reports that he has never smoked. He has never used smokeless tobacco. He reports that he drinks alcohol. He reports that he does not use illicit drugs. family history includes Depression in his other; Diabetes in his father; Neurologic Disorder in his other. No Known Allergies    Review of Systems  Constitutional: Negative for fatigue.  Eyes: Negative for visual disturbance.  Respiratory: Negative for cough, chest tightness and shortness of breath.   Cardiovascular: Positive for palpitations. Negative for leg swelling.  Neurological: Negative for dizziness, syncope, weakness, light-headedness and headaches.  Psychiatric/Behavioral: Negative for sleep disturbance, dysphoric mood and agitation. The patient is nervous/anxious.        Objective:   Physical Exam  Constitutional: He is oriented to person, place, and time. He appears  well-developed and well-nourished.  Cardiovascular: Normal rate and regular rhythm.  Exam reveals no gallop.   No murmur heard. Pulmonary/Chest: Effort normal and breath sounds normal. No respiratory distress. He has no wheezes. He has no rales.  Musculoskeletal: He exhibits no edema.  Neurological: He is alert and oriented to person, place, and time. No cranial nerve deficit.  Psychiatric: He has a normal mood and affect. His behavior is normal. Judgment and thought content normal.          Assessment & Plan:  Long history of anxiety. He has history of possible panic disorder but history have some more general anxiety symptoms and over somatization. Continue counseling. Continue regular exercise. Start sertraline 50 mg once daily. Reassess 4 weeks. Reviewed possible side effects.

## 2015-09-06 ENCOUNTER — Telehealth: Payer: Self-pay | Admitting: Family Medicine

## 2015-09-06 MED ORDER — SERTRALINE HCL 50 MG PO TABS: 50.0000 mg | ORAL_TABLET | Freq: Every day | ORAL | Status: DC

## 2015-09-06 NOTE — Telephone Encounter (Signed)
Mr. Mark Mckenzie called saying his current insurance won't cover Zoloft from January 1st, 2017 through March 11th, 2017. He's wondering if [name] will extend his Rx until the end of March. Please give him a phone call regarding this.  Pt's ph# [phone] Thank you.

## 2015-09-06 NOTE — Telephone Encounter (Signed)
Pt is aware that a 90 day supply has been sent into pharmacy.

## 2015-09-25 ENCOUNTER — Encounter: Payer: Self-pay | Admitting: Family Medicine

## 2015-09-25 ENCOUNTER — Ambulatory Visit (INDEPENDENT_AMBULATORY_CARE_PROVIDER_SITE_OTHER): Payer: BLUE CROSS/BLUE SHIELD | Admitting: Family Medicine

## 2015-09-25 VITALS — BP 130/84 | HR 77 | Temp 98.5°F | Resp 16 | Ht 74.0 in | Wt 190.0 lb

## 2015-09-25 DIAGNOSIS — F410 Panic disorder [episodic paroxysmal anxiety] without agoraphobia: Secondary | ICD-10-CM | POA: Insufficient documentation

## 2015-09-25 NOTE — Progress Notes (Signed)
Pre visit review using our clinic review tool, if applicable. No additional management support is needed unless otherwise documented below in the visit note. 

## 2015-09-25 NOTE — Progress Notes (Signed)
   Subjective:    Patient ID: Mark LintJonathan C Mckenzie, male    DOB: [birth year], 25 y.o.   MRN: [id number]  HPI Follow-up anxiety disorder Refer to prior note. Suspected panic attacks. Patient already receiving regular counseling. We started sertraline 50 mg daily. He is seeing great improvement Less frequent anxiety symptoms and also less severe. Still has frequent palpitations. He is exercising regularly. Minimal caffeine use. No exertional syncope or dizziness. Overall, physical symptoms have been blinded greatly with starting sertraline. Denies side effects.  Past Medical History  Diagnosis Date  . Acne    No past surgical history on file.  reports that he has never smoked. He has never used smokeless tobacco. He reports that he drinks alcohol. He reports that he does not use illicit drugs. family history includes Depression in his other; Diabetes in his father; Neurologic Disorder in his other. No Known Allergies    Review of Systems  Constitutional: Negative for appetite change and unexpected weight change.  Respiratory: Negative for shortness of breath.   Cardiovascular: Positive for palpitations. Negative for chest pain.  Neurological: Negative for syncope.  Psychiatric/Behavioral: Negative for dysphoric mood. The patient is nervous/anxious.        Objective:   Physical Exam  Constitutional: He appears well-developed and well-nourished.  Cardiovascular: Normal rate and regular rhythm.  Exam reveals no gallop.   No murmur heard. Pulmonary/Chest: Effort normal and breath sounds normal. No respiratory distress. He has no wheezes. He has no rales.  Psychiatric: He has a normal mood and affect. His behavior is normal. Judgment and thought content normal.          Assessment & Plan:  Anxiety with suspected panic disorder. Greatly improved on sertraline 50 mg. We discussed possible titration but this point he wishes to wait. He will continue with counseling. Touch base if  symptoms not controlled with the above

## 2015-09-25 NOTE — Patient Instructions (Signed)

## 2015-10-24 ENCOUNTER — Encounter: Payer: Self-pay | Admitting: Family Medicine

## 2015-10-24 ENCOUNTER — Ambulatory Visit (INDEPENDENT_AMBULATORY_CARE_PROVIDER_SITE_OTHER): Payer: BLUE CROSS/BLUE SHIELD | Admitting: Family Medicine

## 2015-10-24 VITALS — BP 122/80 | HR 64 | Temp 97.6°F | Ht 74.0 in | Wt 181.7 lb

## 2015-10-24 DIAGNOSIS — Z0000 Encounter for general adult medical examination without abnormal findings: Secondary | ICD-10-CM | POA: Diagnosis not present

## 2015-10-24 LAB — BASIC METABOLIC PANEL
BUN: 12 mg/dL (ref 6–23)
CALCIUM: 10 mg/dL (ref 8.4–10.5)
CO2: 33 mEq/L — ABNORMAL HIGH (ref 19–32)
Chloride: 102 mEq/L (ref 96–112)
Creatinine, Ser: 0.95 mg/dL (ref 0.40–1.50)
GFR: 102.92 mL/min (ref >60.00)
Glucose, Bld: 111 mg/dL — ABNORMAL HIGH (ref 70–99)
POTASSIUM: 4.2 meq/L (ref 3.5–5.1)
Sodium: 139 mEq/L (ref 135–145)

## 2015-10-24 LAB — CBC WITH DIFFERENTIAL/PLATELET
BASOS PCT: 0.5 % (ref 0.0–3.0)
Basophils Absolute: 0 10*3/uL (ref 0.0–0.1)
EOS PCT: 1.9 % (ref 0.0–5.0)
Eosinophils Absolute: 0.1 10*3/uL (ref 0.0–0.7)
HEMATOCRIT: 48.4 % (ref 39.0–52.0)
HEMOGLOBIN: 16.1 g/dL (ref 13.0–17.0)
LYMPHS PCT: 44.7 % (ref 12.0–46.0)
Lymphs Abs: 2.8 10*3/uL (ref 0.7–4.0)
MCHC: 33.3 g/dL (ref 30.0–36.0)
MCV: 87.6 fl (ref 78.0–100.0)
MONO ABS: 0.4 10*3/uL (ref 0.1–1.0)
MONOS PCT: 6.7 % (ref 3.0–12.0)
Neutro Abs: 2.9 10*3/uL (ref 1.4–7.7)
Neutrophils Relative %: 46.2 % (ref 43.0–77.0)
Platelets: 271 10*3/uL (ref 150.0–400.0)
RBC: 5.52 Mil/uL (ref 4.22–5.81)
RDW: 12.7 % (ref 11.5–15.5)
WBC: 6.3 10*3/uL (ref 4.0–10.5)

## 2015-10-24 LAB — HEPATIC FUNCTION PANEL
ALBUMIN: 5 g/dL (ref 3.5–5.2)
ALK PHOS: 73 U/L (ref 39–117)
ALT: 17 U/L (ref 0–53)
AST: 11 U/L (ref 0–37)
Bilirubin, Direct: 0.2 mg/dL (ref 0.0–0.3)
TOTAL PROTEIN: 7.5 g/dL (ref 6.0–8.3)
Total Bilirubin: 0.9 mg/dL (ref 0.2–1.2)

## 2015-10-24 LAB — TSH: TSH: 1.19 u[IU]/mL (ref 0.35–4.50)

## 2015-10-24 LAB — LIPID PANEL
CHOLESTEROL: 223 mg/dL — AB (ref 0–200)
HDL: 52.6 mg/dL (ref >39.00)
LDL Cholesterol: 153 mg/dL — ABNORMAL HIGH (ref 0–99)
NONHDL: 169.93
Total CHOL/HDL Ratio: 4
Triglycerides: 84 mg/dL (ref 0.0–149.0)
VLDL: 16.8 mg/dL (ref 0.0–40.0)

## 2015-10-24 NOTE — Progress Notes (Signed)
Pre visit review using our clinic review tool, if applicable. No additional management support is needed unless otherwise documented below in the visit note. 

## 2015-10-24 NOTE — Progress Notes (Signed)
   Subjective:    Patient ID: Mark Mckenzie, male    DOB: [birth year], 25 y.o.   MRN: [id number]  HPI Patient is seen for well visit and for form completion for preemployment. He is looking at a job doing Presenter, broadcasting. His form requires completion for immunization verification for MMR, hepatitis B, varicella, tetanus, and tuberculosis screening. He had negative PPD February 2016. We were able to confirm prior immunizations for MMR, hepatitis B, and Tdap.  He states he had chickenpox relates 3. We do not have any confirmation of immunity.  Generally very healthy. He takes no medications-other than sertraline for anxiety symptoms.. Exercises regularly Anxiety symptoms have been improved with sertraline. Nonsmoker. No illicit drug use history.  Past Medical History  Diagnosis Date  . Acne    No past surgical history on file.  reports that he has never smoked. He has never used smokeless tobacco. He reports that he drinks alcohol. He reports that he does not use illicit drugs. family history includes Depression in his other; Diabetes in his father; Neurologic Disorder in his other. No Known Allergies    Review of Systems  Constitutional: Negative for fever, activity change, appetite change and fatigue.  HENT: Negative for congestion, ear pain and trouble swallowing.   Eyes: Negative for pain and visual disturbance.  Respiratory: Negative for cough, shortness of breath and wheezing.   Cardiovascular: Negative for chest pain and palpitations.  Gastrointestinal: Negative for nausea, vomiting, abdominal pain, diarrhea, constipation, blood in stool, abdominal distention and rectal pain.  Genitourinary: Negative for dysuria, hematuria and testicular pain.  Musculoskeletal: Negative for joint swelling and arthralgias.  Skin: Negative for rash.  Neurological: Negative for dizziness, syncope and headaches.  Hematological: Negative for adenopathy.  Psychiatric/Behavioral: Negative for  confusion and dysphoric mood.       Objective:   Physical Exam  Constitutional: He is oriented to person, place, and time. He appears well-developed and well-nourished. No distress.  HENT:  Head: Normocephalic and atraumatic.  Right Ear: External ear normal.  Left Ear: External ear normal.  Mouth/Throat: Oropharynx is clear and moist.  Eyes: Conjunctivae and EOM are normal. Pupils are equal, round, and reactive to light.  Neck: Normal range of motion. Neck supple. No thyromegaly present.  Cardiovascular: Normal rate, regular rhythm and normal heart sounds.   No murmur heard. Pulmonary/Chest: No respiratory distress. He has no wheezes. He has no rales.  Abdominal: Soft. Bowel sounds are normal. He exhibits no distension and no mass. There is no tenderness. There is no rebound and no guarding.  Musculoskeletal: He exhibits no edema.  Lymphadenopathy:    He has no cervical adenopathy.  Neurological: He is alert and oriented to person, place, and time. He displays normal reflexes. No cranial nerve deficit.  Skin: No rash noted.  Psychiatric: He has a normal mood and affect.          Assessment & Plan:  Physical exam. Initial blood pressure elevated 140/108. Repeat after rest was normal range. Continue regular exercise habits. Patient requesting labs today. These will be obtained. Check Varicella IgG to confirm immunity to chickenpox

## 2015-10-25 ENCOUNTER — Telehealth: Payer: Self-pay | Admitting: Family Medicine

## 2015-10-25 LAB — VARICELLA ZOSTER ANTIBODY, IGG: Varicella IgG: 732.7 Index — ABNORMAL HIGH (ref <135.00)

## 2015-10-25 NOTE — Telephone Encounter (Signed)
Spoke with patient.

## 2015-10-25 NOTE — Telephone Encounter (Signed)
Patient called again.  He has his lab results, but he actually wants to talk to someone about them.  He has questions.

## 2015-10-25 NOTE — Telephone Encounter (Addendum)
Patient would like the results of his recent labs. °

## 2015-10-26 ENCOUNTER — Other Ambulatory Visit: Payer: Self-pay | Admitting: Family Medicine

## 2015-10-26 ENCOUNTER — Encounter: Payer: Self-pay | Admitting: Family Medicine

## 2015-10-26 DIAGNOSIS — R739 Hyperglycemia, unspecified: Secondary | ICD-10-CM

## 2015-10-26 NOTE — Telephone Encounter (Signed)
I have educated pt on this. His father is a diabetic which concerns him. I told him that this is something to monitor yearly. He is on a strict diet and exercise plan so he was instructed to continue this. Any other advise for pt?

## 2015-10-26 NOTE — Telephone Encounter (Signed)
He wants an A1C?

## 2015-10-27 ENCOUNTER — Other Ambulatory Visit (INDEPENDENT_AMBULATORY_CARE_PROVIDER_SITE_OTHER): Payer: BLUE CROSS/BLUE SHIELD

## 2015-10-27 DIAGNOSIS — R739 Hyperglycemia, unspecified: Secondary | ICD-10-CM

## 2015-10-27 LAB — GLUCOSE, RANDOM: Glucose, Bld: 102 mg/dL — ABNORMAL HIGH (ref 70–99)

## 2015-10-30 ENCOUNTER — Other Ambulatory Visit (INDEPENDENT_AMBULATORY_CARE_PROVIDER_SITE_OTHER): Payer: BLUE CROSS/BLUE SHIELD

## 2015-10-30 ENCOUNTER — Ambulatory Visit: Payer: BLUE CROSS/BLUE SHIELD | Admitting: Family Medicine

## 2015-10-30 ENCOUNTER — Ambulatory Visit (INDEPENDENT_AMBULATORY_CARE_PROVIDER_SITE_OTHER): Payer: BLUE CROSS/BLUE SHIELD | Admitting: Family Medicine

## 2015-10-30 ENCOUNTER — Other Ambulatory Visit: Payer: Self-pay | Admitting: Family Medicine

## 2015-10-30 ENCOUNTER — Encounter: Payer: Self-pay | Admitting: Family Medicine

## 2015-10-30 VITALS — BP 130/90 | HR 86 | Temp 98.0°F | Ht 74.0 in | Wt 179.9 lb

## 2015-10-30 DIAGNOSIS — R739 Hyperglycemia, unspecified: Secondary | ICD-10-CM

## 2015-10-30 DIAGNOSIS — R7309 Other abnormal glucose: Secondary | ICD-10-CM

## 2015-10-30 DIAGNOSIS — Z111 Encounter for screening for respiratory tuberculosis: Secondary | ICD-10-CM | POA: Diagnosis not present

## 2015-10-30 DIAGNOSIS — Z23 Encounter for immunization: Secondary | ICD-10-CM | POA: Diagnosis not present

## 2015-10-30 DIAGNOSIS — Z0000 Encounter for general adult medical examination without abnormal findings: Secondary | ICD-10-CM

## 2015-10-30 DIAGNOSIS — F410 Panic disorder [episodic paroxysmal anxiety] without agoraphobia: Secondary | ICD-10-CM | POA: Diagnosis not present

## 2015-10-30 LAB — HEMOGLOBIN A1C: HEMOGLOBIN A1C: 5.5 % (ref 4.6–6.5)

## 2015-10-30 NOTE — Progress Notes (Signed)
   Subjective:    Patient ID: Mark Mckenzie, male    DOB: 1990-12-04, 25 y.o.   MRN: [id number]  HPI Patient had recent glucose 111 on fasting labs. We had him come back for repeat fasting glucose of 102 with hemoglobin A1c 5.5%. Positive family history of type 2 diabetes in his father. Patient has not had any symptoms of weight loss, polyuria, or polydipsia. He is fairly diligent with exercise. He has been extremely anxious regarding blood sugar results as above.  He has history of chronic anxiety. Probable panic disorder. He also has a tendency toward pervasive worries. He is getting regular counseling. Currently taking sertraline 50 mg once daily. Tolerating without side effects. Anxiety symptoms are still fairly constant.  Past Medical History  Diagnosis Date  . Acne    No past surgical history on file.  reports that he has never smoked. He has never used smokeless tobacco. He reports that he drinks alcohol. He reports that he does not use illicit drugs. family history includes Depression in his other; Diabetes in his father; Neurologic Disorder in his other. No Known Allergies    Review of Systems  Constitutional: Negative for appetite change and unexpected weight change.  Respiratory: Negative for shortness of breath.   Cardiovascular: Negative for chest pain.  Gastrointestinal: Negative for nausea and abdominal pain.  Endocrine: Negative for polydipsia and polyuria.  Genitourinary: Negative for dysuria.  Psychiatric/Behavioral: Negative for agitation. The patient is nervous/anxious.        Objective:   Physical Exam  Constitutional: He appears well-developed and well-nourished. No distress.  Cardiovascular: Normal rate and regular rhythm.   Pulmonary/Chest: Effort normal and breath sounds normal. No respiratory distress. He has no wheezes. He has no rales.  Psychiatric: He has a normal mood and affect. His behavior is normal. Judgment and thought content normal.           Assessment & Plan:  #1 anxiety disorder. Probable panic disorder. Question generalized anxiety. Continue counseling. Titrate sertraline to 100 mg daily. Touch base in 4 weeks regarding response to increase in medication  #2 minimally elevated fasting glucose recently of 102. Positive family history type 2 diabetes. We discussed factors regarding prevention-diet and exercise. Consider repeat fasting glucose and A1c in 6 months.

## 2015-10-30 NOTE — Progress Notes (Signed)
Pre visit review using our clinic review tool, if applicable. No additional management support is needed unless otherwise documented below in the visit note. 

## 2015-10-30 NOTE — Patient Instructions (Signed)
Go ahead and increase the Sertraline up to 100 mg daily.

## 2015-11-02 LAB — TB SKIN TEST
INDURATION: 0 mm
TB SKIN TEST: NEGATIVE

## 2015-11-09 ENCOUNTER — Encounter: Payer: Self-pay | Admitting: Family Medicine

## 2015-11-09 ENCOUNTER — Ambulatory Visit (INDEPENDENT_AMBULATORY_CARE_PROVIDER_SITE_OTHER): Payer: BLUE CROSS/BLUE SHIELD | Admitting: Family Medicine

## 2015-11-09 VITALS — BP 110/80 | HR 86 | Temp 98.0°F | Ht 74.0 in | Wt 175.0 lb

## 2015-11-09 DIAGNOSIS — H66002 Acute suppurative otitis media without spontaneous rupture of ear drum, left ear: Secondary | ICD-10-CM

## 2015-11-09 MED ORDER — AMOXICILLIN 875 MG PO TABS: 875.0000 mg | ORAL_TABLET | Freq: Two times a day (BID) | ORAL | Status: DC

## 2015-11-09 NOTE — Progress Notes (Signed)
Pre visit review using our clinic review tool, if applicable. No additional management support is needed unless otherwise documented below in the visit note. 

## 2015-11-09 NOTE — Progress Notes (Signed)
   Subjective:    Patient ID: Mark Mckenzie, male    DOB: 03-Aug-1991, 25 y.o.   MRN: [id number]  HPI Acute visit Last week had sinus congestion and URI type symptoms. A few days ago developed left ear pain and left ear pressure. Some mild ringing. No loss of hearing. No drainage. No fevers or chills. Symptoms are slightly better today than yesterday. Pressure sometimes relieved with Valsalva maneuver Sinus congestion is slowly improving  Past Medical History  Diagnosis Date  . Acne    No past surgical history on file.  reports that he has never smoked. He has never used smokeless tobacco. He reports that he drinks alcohol. He reports that he does not use illicit drugs. family history includes Depression in his other; Diabetes in his father; Neurologic Disorder in his other. No Known Allergies    Review of Systems  Constitutional: Negative for fever and chills.  HENT: Positive for congestion and ear pain. Negative for ear discharge and hearing loss.   Respiratory: Negative for cough.   Neurological: Negative for headaches.       Objective:   Physical Exam  Constitutional: He appears well-developed and well-nourished.  HENT:  Right Ear: External ear normal.  Mouth/Throat: Oropharynx is clear and moist.  Left eardrum reveals distorted landmarks. He has some bulging along the superior aspect with erythema. No perforation  Neck: Neck supple.  Cardiovascular: Normal rate and regular rhythm.   Pulmonary/Chest: Effort normal and breath sounds normal. No respiratory distress. He has no wheezes. He has no rales.          Assessment & Plan:  Acute left otitis media. We explained these are sometimes self resolving-and he states his symptoms are slightly improved compared to yesterday.. We wrote for amoxicillin 875 mg twice a day for 10 days if he has any recurrent pain or fever.

## 2015-11-09 NOTE — Patient Instructions (Signed)

## 2015-12-15 ENCOUNTER — Telehealth: Payer: Self-pay | Admitting: Family Medicine

## 2015-12-15 MED ORDER — SERTRALINE HCL 100 MG PO TABS: 100.0000 mg | ORAL_TABLET | Freq: Every day | ORAL | Status: DC

## 2015-12-15 NOTE — Telephone Encounter (Signed)
Pt needs new rx sertraline 100 mg #90. Pt has new pharm [facility]

## 2015-12-15 NOTE — Telephone Encounter (Signed)
Medication refilled for patient. 

## 2016-01-26 ENCOUNTER — Ambulatory Visit: Payer: Self-pay | Admitting: Family Medicine

## 2016-02-09 DIAGNOSIS — F411 Generalized anxiety disorder: Secondary | ICD-10-CM | POA: Diagnosis not present

## 2016-02-14 ENCOUNTER — Ambulatory Visit (INDEPENDENT_AMBULATORY_CARE_PROVIDER_SITE_OTHER): Payer: BLUE CROSS/BLUE SHIELD | Admitting: Family Medicine

## 2016-02-14 ENCOUNTER — Encounter: Payer: Self-pay | Admitting: Family Medicine

## 2016-02-14 VITALS — BP 110/80 | HR 64 | Temp 97.4°F | Ht 74.0 in | Wt 175.0 lb

## 2016-02-14 DIAGNOSIS — K529 Noninfective gastroenteritis and colitis, unspecified: Secondary | ICD-10-CM

## 2016-02-14 DIAGNOSIS — F410 Panic disorder [episodic paroxysmal anxiety] without agoraphobia: Secondary | ICD-10-CM | POA: Diagnosis not present

## 2016-02-14 DIAGNOSIS — Z833 Family history of diabetes mellitus: Secondary | ICD-10-CM | POA: Diagnosis not present

## 2016-02-14 DIAGNOSIS — E785 Hyperlipidemia, unspecified: Secondary | ICD-10-CM | POA: Diagnosis not present

## 2016-02-14 LAB — LIPID PANEL
CHOLESTEROL: 161 mg/dL (ref 0–200)
HDL: 33.6 mg/dL — ABNORMAL LOW (ref >39.00)
LDL Cholesterol: 111 mg/dL — ABNORMAL HIGH (ref 0–99)
NonHDL: 127.43
TRIGLYCERIDES: 83 mg/dL (ref 0.0–149.0)
Total CHOL/HDL Ratio: 5
VLDL: 16.6 mg/dL (ref 0.0–40.0)

## 2016-02-14 LAB — HEMOGLOBIN A1C: Hgb A1c MFr Bld: 5.7 % (ref 4.6–6.5)

## 2016-02-14 NOTE — Progress Notes (Signed)
Pre visit review using our clinic review tool, if applicable. No additional management support is needed unless otherwise documented below in the visit note. 

## 2016-02-14 NOTE — Progress Notes (Signed)
   Subjective:    Patient ID: Mark Mckenzie, male    DOB: March 02, 1991, 25 y.o.   MRN: [id number]  HPI Patient here to discuss several issues  Has chronic anxiety with panic disorder. Currently doing much better with increased dose of sertraline 100 mg. 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 He is trying to get into medical school No side effects from sertraline.  Hyperlipidemia. Labs a few months ago cholesterol 223, LDL 153, HDL 52. Father had type 2 diabetes. He has made some dietary changes and is requesting repeat fasting lipids today. He has reduced saturated fats. No family history of premature CAD  Chronic history of frequent loose stools. He initially felt this was more anxiety related. Frequency tends to vary considerably. No family history of celiac disease. No bloody stools. No appetite or weight changes. No abdominal pain. No recent antibiotics. No nausea or vomiting. No history of lactose intolerance or food allergy. Patient requesting celiac testing. He has tried to go gluten-free but only for about 2 weeks.  Past Medical History  Diagnosis Date  . Acne    No past surgical history on file.  reports that he has never smoked. He has never used smokeless tobacco. He reports that he drinks alcohol. He reports that he does not use illicit drugs. family history includes Depression in his other; Diabetes in his father; Neurologic Disorder in his other. No Known Allergies .   Review of Systems  Constitutional: Negative for fever, chills and fatigue.  Respiratory: Negative for cough and shortness of breath.   Cardiovascular: Negative for chest pain.  Gastrointestinal: Positive for diarrhea. Negative for nausea, vomiting, abdominal pain and blood in stool.  Genitourinary: Negative for dysuria.  Neurological: Negative for headaches.  Psychiatric/Behavioral: Negative for sleep disturbance, dysphoric mood and agitation.       Objective:   Physical Exam    Constitutional: He appears well-developed and well-nourished.  Cardiovascular: Normal rate and regular rhythm.   Pulmonary/Chest: Effort normal and breath sounds normal. No respiratory distress. He has no wheezes. He has no rales.  Abdominal: Soft. Bowel sounds are normal. He exhibits no distension. There is no tenderness. There is no rebound.  Musculoskeletal: He exhibits no edema.          Assessment & Plan:  #1 panic disorder. Stable on sertraline. No change of medication  #2 hyperlipidemia. Patient requesting fasting lipid panel today. Continue low saturated fat diet  #3 frequent loose stools. He does not any red flags such as appetite or weight changes, fever, bloody stools, or abdominal pain. Will check celiac antibody panel. He had questions regarding medications such as colestipol that at this point is not interested in that. We did discuss possible GI referral but symptoms are very stable this time and he declines  Mark CoveyBruce W Burchette MD [facility]

## 2016-02-15 LAB — CELIAC PANEL 10
ENDOMYSIAL SCREEN: NEGATIVE
GLIADIN IGA: 3 U (ref <20)
GLIADIN IGG: 4 U (ref <20)
IgA: 166 mg/dL (ref 81–463)
TISSUE TRANSGLUT AB: 1 U/mL (ref <6)
Tissue Transglutaminase Ab, IgA: 1 U/mL (ref <4)

## 2016-02-16 ENCOUNTER — Encounter: Payer: Self-pay | Admitting: Family Medicine

## 2016-02-19 DIAGNOSIS — R7301 Impaired fasting glucose: Secondary | ICD-10-CM | POA: Diagnosis not present

## 2016-03-15 DIAGNOSIS — F411 Generalized anxiety disorder: Secondary | ICD-10-CM | POA: Diagnosis not present

## 2016-04-21 ENCOUNTER — Encounter: Payer: Self-pay | Admitting: Family Medicine

## 2016-05-14 DIAGNOSIS — J069 Acute upper respiratory infection, unspecified: Secondary | ICD-10-CM | POA: Diagnosis not present

## 2016-06-06 ENCOUNTER — Other Ambulatory Visit: Payer: Self-pay | Admitting: Family Medicine

## 2016-07-01 DIAGNOSIS — Z23 Encounter for immunization: Secondary | ICD-10-CM | POA: Diagnosis not present

## 2016-10-11 DIAGNOSIS — J101 Influenza due to other identified influenza virus with other respiratory manifestations: Secondary | ICD-10-CM | POA: Diagnosis not present

## 2016-10-23 ENCOUNTER — Telehealth: Payer: Self-pay | Admitting: Family Medicine

## 2016-10-23 NOTE — Telephone Encounter (Signed)
Spoke with pt and he states that he needs his immunization records. He was told per [facility] that they were not available(?). Per [employer] [facility] website pt is up to date in all immunizations. Pt already has varicella titer. Printed report and left at front desk for pt to pick up. Nothing further needed at this time.

## 2016-10-23 NOTE — Telephone Encounter (Signed)
[facility] - Client [place] [facility]     Patient Name: [facility] Initial Comment Caller states that he has some questions about immunizations.   DOB: 1991/05/31      Nurse Assessment  Nurse: Venetia Maxon, RN, Manuela Schwartz Date/Time (Eastern Time): 10/23/2016 4:15:20 PM  Confirm and document reason for call. If symptomatic, describe symptoms. ---Caller states that he has some questions about immunizations. Caller states he has a printout record of his vaccines. He has had MMR as a child. does he have to have a titer or is there a titer . His pediatricians office is since closed.   Does the patient have any new or worsening symptoms? ---Yes   Will a triage be completed? ---Yes   Related visit to physician within the last 2 weeks? ---No   Does the PT have any chronic conditions? (i.e. diabetes, asthma, etc.) ---No   Is this a behavioral health or substance abuse call? ---No     Guidelines     Guideline Title Affirmed Question Affirmed Notes        Final Disposition User   Clinical Call Whitaker, RN, Manuela Schwartz   Comments   The RN advised the caller that he would only need a lab tube of blood for the MMR titer to show level of antibodies he currently has from passive immunity ( vaccine) to Measles, Mumps and rubella. Caller verbalized understanding,     [facility] - Client [place] [facility]     Patient Name: [facility] Initial Comment Caller states that he has some questions about immunizations.   DOB: 05/14/1991      Nurse Assessment  Nurse: Venetia Maxon, RN, Manuela Schwartz Date/Time (Eastern Time): 10/23/2016 4:15:20 PM  Confirm and document reason for call. If symptomatic, describe symptoms. ---Caller states that he has some questions about immunizations. Caller states he has a printout record of his vaccines. He has had MMR as a child. does he have to have a titer or is there a  titer . His pediatricians office is since closed.   Does the patient have any new or worsening symptoms? ---Yes   Will a triage be completed? ---Yes   Related visit to physician within the last 2 weeks? ---No   Does the PT have any chronic conditions? (i.e. diabetes, asthma, etc.) ---No   Is this a behavioral health or substance abuse call? ---No     Guidelines     Guideline Title Affirmed Question Affirmed Notes        Final Disposition User   Clinical Call Whitaker, RN, Manuela Schwartz   Comments   The RN advised the caller that he would only need a lab tube of blood for the MMR titer to show level of antibodies he currently has from passive immunity ( vaccine) to Measles, Mumps and rubella. Caller verbalized understanding,

## 2016-11-28 ENCOUNTER — Encounter: Payer: Self-pay | Admitting: Family Medicine

## 2016-12-07 ENCOUNTER — Other Ambulatory Visit: Payer: Self-pay | Admitting: Family Medicine

## 2017-02-12 ENCOUNTER — Encounter: Payer: Self-pay | Admitting: Family Medicine

## 2017-02-12 ENCOUNTER — Ambulatory Visit (INDEPENDENT_AMBULATORY_CARE_PROVIDER_SITE_OTHER): Payer: BLUE CROSS/BLUE SHIELD | Admitting: Family Medicine

## 2017-02-12 VITALS — BP 120/88 | HR 57 | Temp 97.8°F | Ht 75.0 in | Wt 190.5 lb

## 2017-02-12 DIAGNOSIS — Z833 Family history of diabetes mellitus: Secondary | ICD-10-CM

## 2017-02-12 DIAGNOSIS — Z0000 Encounter for general adult medical examination without abnormal findings: Secondary | ICD-10-CM | POA: Diagnosis not present

## 2017-02-12 LAB — CBC WITH DIFFERENTIAL/PLATELET
BASOS ABS: 0 10*3/uL (ref 0.0–0.1)
Basophils Relative: 0.8 % (ref 0.0–3.0)
EOS ABS: 0.1 10*3/uL (ref 0.0–0.7)
Eosinophils Relative: 3.1 % (ref 0.0–5.0)
HCT: 45.9 % (ref 39.0–52.0)
Hemoglobin: 15.9 g/dL (ref 13.0–17.0)
LYMPHS ABS: 2 10*3/uL (ref 0.7–4.0)
Lymphocytes Relative: 45.3 % (ref 12.0–46.0)
MCHC: 34.7 g/dL (ref 30.0–36.0)
MCV: 85.3 fl (ref 78.0–100.0)
MONOS PCT: 6.3 % (ref 3.0–12.0)
Monocytes Absolute: 0.3 10*3/uL (ref 0.1–1.0)
Neutro Abs: 2 10*3/uL (ref 1.4–7.7)
Neutrophils Relative %: 44.5 % (ref 43.0–77.0)
Platelets: 219 10*3/uL (ref 150.0–400.0)
RBC: 5.38 Mil/uL (ref 4.22–5.81)
RDW: 12.9 % (ref 11.5–15.5)
WBC: 4.5 10*3/uL (ref 4.0–10.5)

## 2017-02-12 LAB — HEPATIC FUNCTION PANEL
ALBUMIN: 4.6 g/dL (ref 3.5–5.2)
ALK PHOS: 62 U/L (ref 39–117)
ALT: 12 U/L (ref 0–53)
AST: 10 U/L (ref 0–37)
BILIRUBIN DIRECT: 0.1 mg/dL (ref 0.0–0.3)
BILIRUBIN TOTAL: 0.5 mg/dL (ref 0.2–1.2)
Total Protein: 6.8 g/dL (ref 6.0–8.3)

## 2017-02-12 LAB — BASIC METABOLIC PANEL
BUN: 15 mg/dL (ref 6–23)
CALCIUM: 9.3 mg/dL (ref 8.4–10.5)
CO2: 31 meq/L (ref 19–32)
CREATININE: 0.98 mg/dL (ref 0.40–1.50)
Chloride: 104 mEq/L (ref 96–112)
GFR: 98.26 mL/min (ref >60.00)
GLUCOSE: 101 mg/dL — AB (ref 70–99)
Potassium: 4.4 mEq/L (ref 3.5–5.1)
SODIUM: 141 meq/L (ref 135–145)

## 2017-02-12 LAB — TSH: TSH: 1.05 u[IU]/mL (ref 0.35–4.50)

## 2017-02-12 LAB — LIPID PANEL
CHOL/HDL RATIO: 5
Cholesterol: 207 mg/dL — ABNORMAL HIGH (ref 0–200)
HDL: 39.7 mg/dL (ref >39.00)
LDL Cholesterol: 143 mg/dL — ABNORMAL HIGH (ref 0–99)
NONHDL: 167.54
Triglycerides: 122 mg/dL (ref 0.0–149.0)
VLDL: 24.4 mg/dL (ref 0.0–40.0)

## 2017-02-12 LAB — HEMOGLOBIN A1C: Hgb A1c MFr Bld: 5.7 % (ref 4.6–6.5)

## 2017-02-12 NOTE — Progress Notes (Signed)
Subjective:     Patient ID: Mark Mckenzie, male   DOB: 04/29/1991, 26 y.o.   MRN: [id number]  HPI Patient seen for physical exam. Recently diagnosed with latent TB. He thinks this may been related to trip to [place] back in 2015. He had positive quantiferon screen with negative chest x-ray. He is on second month of 4 months of rifampin.  Denies any cough or fever at this time.  He is married and has first child on the way in October. Nonsmoker. No regular alcohol use. He has history of panic disorder which is been controlled well with sertraline. 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 Generally feels well overall  Family history is negative for follow type 2 diabetes and coronary disease in his 2870s. His father is age 26 and has some recent mild cognitive impairment.  Past Medical History:  Diagnosis Date  . Acne   . TB lung, latent    No past surgical history on file.  reports that he has never smoked. He has never used smokeless tobacco. He reports that he drinks alcohol. He reports that he does not use drugs. family history includes Depression in his other; Diabetes in his father; Neurologic Disorder in his other. No Known Allergies   Review of Systems  Constitutional: Negative for activity change, appetite change, fatigue and fever.  HENT: Negative for congestion, ear pain and trouble swallowing.   Eyes: Negative for pain and visual disturbance.  Respiratory: Negative for cough, shortness of breath and wheezing.   Cardiovascular: Negative for chest pain and palpitations.  Gastrointestinal: Negative for abdominal distention, abdominal pain, blood in stool, constipation, diarrhea, nausea, rectal pain and vomiting.  Genitourinary: Negative for dysuria, hematuria and testicular pain.  Musculoskeletal: Negative for arthralgias and joint swelling.  Skin: Negative for rash.  Neurological: Negative for dizziness, syncope and headaches.   Hematological: Negative for adenopathy.  Psychiatric/Behavioral: Negative for confusion and dysphoric mood.       Objective:   Physical Exam  Constitutional: He is oriented to person, place, and time. He appears well-developed and well-nourished. No distress.  HENT:  Head: Normocephalic and atraumatic.  Right Ear: External ear normal.  Left Ear: External ear normal.  Mouth/Throat: Oropharynx is clear and moist.  Eyes: Conjunctivae and EOM are normal. Pupils are equal, round, and reactive to light.  Neck: Normal range of motion. Neck supple. No thyromegaly present.  Cardiovascular: Normal rate, regular rhythm and normal heart sounds.   No murmur heard. Pulmonary/Chest: No respiratory distress. He has no wheezes. He has no rales.  Abdominal: Soft. Bowel sounds are normal. He exhibits no distension and no mass. There is no tenderness. There is no rebound and no guarding.  Musculoskeletal: He exhibits no edema.  Lymphadenopathy:    He has no cervical adenopathy.  Neurological: He is alert and oriented to person, place, and time. He displays normal reflexes. No cranial nerve deficit.  Skin: No rash noted.  Psychiatric: He has a normal mood and affect.       Assessment:     Physical exam. Generally healthy 26 year old male. He has anxiety disorder which is well controlled with sertraline. Recent diagnosis of latent tuberculosis as above currently on rifampin    Plan:     -We discussed obtaining some screening labs. Patient request hemoglobin A1c though he has no concerning symptoms for diabetes -Continue regular exercise habits -Immunizations up-to-date  Mark CoveyBruce W Aydan Levitz MD [facility]

## 2017-02-25 DIAGNOSIS — R0609 Other forms of dyspnea: Secondary | ICD-10-CM | POA: Diagnosis not present

## 2017-04-14 DIAGNOSIS — D563 Thalassemia minor: Secondary | ICD-10-CM | POA: Diagnosis not present

## 2017-05-29 ENCOUNTER — Other Ambulatory Visit: Payer: Self-pay | Admitting: Family Medicine

## 2017-05-29 MED ORDER — ACYCLOVIR 800 MG PO TABS: ORAL_TABLET | ORAL | 1 refills | Status: DC

## 2017-05-29 NOTE — Telephone Encounter (Signed)
Rx done. 

## 2017-05-29 NOTE — Telephone Encounter (Signed)
Pt would also like to request refill of  acyclovir (ZOVIRAX) 800 MG tablet  [id number] [address] - [place], [place] - [address]  (this pharmacy has never filled this before)

## 2017-06-05 ENCOUNTER — Encounter: Payer: Self-pay | Admitting: Family Medicine

## 2017-06-09 ENCOUNTER — Encounter: Payer: Self-pay | Admitting: Family Medicine

## 2017-12-29 ENCOUNTER — Other Ambulatory Visit: Payer: Self-pay | Admitting: Family Medicine

## 2018-01-27 ENCOUNTER — Other Ambulatory Visit: Payer: Self-pay | Admitting: Family Medicine

## 2018-02-13 ENCOUNTER — Encounter: Payer: Self-pay | Admitting: Family Medicine

## 2018-02-13 ENCOUNTER — Ambulatory Visit (INDEPENDENT_AMBULATORY_CARE_PROVIDER_SITE_OTHER): Payer: BLUE CROSS/BLUE SHIELD | Admitting: Family Medicine

## 2018-02-13 VITALS — BP 110/74 | HR 72 | Temp 98.5°F | Ht 74.5 in | Wt 191.2 lb

## 2018-02-13 DIAGNOSIS — Z0000 Encounter for general adult medical examination without abnormal findings: Secondary | ICD-10-CM

## 2018-02-13 LAB — HEMOGLOBIN A1C: Hgb A1c MFr Bld: 5.4 % (ref 4.6–6.5)

## 2018-02-13 LAB — LIPID PANEL
CHOLESTEROL: 230 mg/dL — AB (ref 0–200)
HDL: 47.2 mg/dL (ref >39.00)
LDL CALC: 172 mg/dL — AB (ref 0–99)
NonHDL: 182.87
TRIGLYCERIDES: 54 mg/dL (ref 0.0–149.0)
Total CHOL/HDL Ratio: 5
VLDL: 10.8 mg/dL (ref 0.0–40.0)

## 2018-02-13 NOTE — Progress Notes (Signed)
  Subjective:     Patient ID: Mark Mckenzie, male   DOB: [birth year], 27 y.o.   MRN: [id number]  HPI Patient seen for physical exam. He is married and has 2730-month-old son. 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 He is very excited about that.  He has history of panic disorder which is currently stable on sertraline 100 mg daily. Very health conscious. He exercises regularly and tries to eat very well. He's had mild hyperlipidemia in the past.  Family history reviewed. Mother had breast cancer. Father had MI at age 27. There had been some question of his father having type 2 diabetes but he does not think that information was accurate.  Pt is non-smoker.  Past Medical History:  Diagnosis Date  . Acne   . TB lung, latent    No past surgical history on file.  reports that he has never smoked. He has never used smokeless tobacco. He reports that he drinks alcohol. He reports that he does not use drugs. family history includes Depression in his other; Diabetes in his father; Neurologic Disorder in his other. No Known Allergies   Review of Systems  Constitutional: Negative for activity change, appetite change, fatigue and fever.  HENT: Negative for congestion, ear pain and trouble swallowing.   Eyes: Negative for pain and visual disturbance.  Respiratory: Negative for cough, shortness of breath and wheezing.   Cardiovascular: Negative for chest pain and palpitations.  Gastrointestinal: Negative for abdominal distention, abdominal pain, blood in stool, constipation, diarrhea, nausea, rectal pain and vomiting.  Genitourinary: Negative for dysuria, hematuria and testicular pain.  Musculoskeletal: Negative for arthralgias and joint swelling.  Skin: Negative for rash.  Neurological: Negative for dizziness, syncope and headaches.  Hematological: Negative for adenopathy.  Psychiatric/Behavioral: Negative for confusion and dysphoric mood.       Objective:   Physical Exam  Constitutional: He is oriented to person, place, and time. He appears well-developed and well-nourished.  HENT:  Right Ear: External ear normal.  Left Ear: External ear normal.  Mouth/Throat: Oropharynx is clear and moist.  Neck: Neck supple.  Cardiovascular: Normal rate and regular rhythm. Exam reveals no gallop.  Pulmonary/Chest: Effort normal and breath sounds normal. He has no wheezes. He has no rales.  Abdominal: Soft. Bowel sounds are normal. He exhibits no mass. There is no tenderness.  Musculoskeletal: He exhibits no edema.  Lymphadenopathy:    He has no cervical adenopathy.  Neurological: He is alert and oriented to person, place, and time. No cranial nerve deficit.  Skin: No rash noted.       Assessment:     Physical exam. Following issues were discussed    Plan:     -Recheck labs with lipid panel and hemoglobin A1c -Continue regular exercise habits -Patient had questions regarding possible tapering of sertraline. We felt would be best to continue with this until getting through the first year to medical school before making any significant changes there  Kristian CoveyBruce W Burchette MD [facility]

## 2018-02-15 ENCOUNTER — Encounter: Payer: Self-pay | Admitting: Family Medicine

## 2018-02-16 ENCOUNTER — Other Ambulatory Visit: Payer: Self-pay | Admitting: Family Medicine

## 2018-02-25 ENCOUNTER — Other Ambulatory Visit: Payer: Self-pay | Admitting: Family Medicine

## 2018-06-11 DIAGNOSIS — F411 Generalized anxiety disorder: Secondary | ICD-10-CM | POA: Diagnosis not present

## 2018-09-21 ENCOUNTER — Encounter: Payer: Self-pay | Admitting: Psychiatry

## 2018-09-21 ENCOUNTER — Encounter

## 2018-09-21 ENCOUNTER — Ambulatory Visit (INDEPENDENT_AMBULATORY_CARE_PROVIDER_SITE_OTHER): Payer: BLUE CROSS/BLUE SHIELD | Admitting: Psychiatry

## 2018-09-21 DIAGNOSIS — F411 Generalized anxiety disorder: Secondary | ICD-10-CM

## 2018-09-21 NOTE — Progress Notes (Signed)
      [facility] Counselor/Therapist Progress Note  Patient ID: Mark Mckenzie, MRN: [id number],    Date: 09/21/2018  Time Spent: 51 minutes   Treatment Type: Individual Therapy  Reported Symptoms: Anxious Mood  Mental Status Exam:  Appearance:   Casual and Well Groomed     Behavior:  Appropriate  Motor:  Normal  Speech/Language:   Clear and Coherent  Affect:  Appropriate  Mood:  anxious  Thought process:  normal  Thought content:    WNL  Sensory/Perceptual disturbances:    WNL  Orientation:  oriented to person, place, time/date and situation  Attention:  Good  Concentration:  Good  Memory:  WNL  Fund of knowledge:   Good  Insight:    Good  Judgment:   Good  Impulse Control:  Good   Risk Assessment: Danger to Self:  No Self-injurious Behavior: No Danger to Others: No Duty to Warn:no Physical Aggression / Violence:No  Access to Firearms a concern: No  Gang Involvement:No   Subjective: 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  His wife is working full-time while his toddler son is in a Designer, multimedia.  Otherwise he is cared for by a family member.  He notes that sometimes his thoughts can get obsessive and he can ruminate over whether or not he is happy in his marriage.  When he steps back from it he is able to see that he is quite happy and that these are just intrusive thoughts.  The client has focused on increasing his cardio workout 3 times a week.  He has found this helpful in managing his anxiety.  He finds it difficult to navigate parenthood and medicine together.  He hopes to be able to find a medium point that he and his wife agree on.  We discussed parenting styles and ways that his son can be more connected to him.  The client has been taking a break during school and spending time at home during the day with his son.  He has found this very positive he will continue to do so.   Interventions: Solution-Oriented/Positive  Psychology and Insight-Oriented  Diagnosis:   ICD-10-CM   1. Generalized anxiety disorder F41.1     Plan: Parenting, exercise, positive self talk.  Mark Mckenzie Mark Mckenzie, [place]

## 2018-10-26 ENCOUNTER — Ambulatory Visit (INDEPENDENT_AMBULATORY_CARE_PROVIDER_SITE_OTHER): Payer: BLUE CROSS/BLUE SHIELD | Admitting: Psychiatry

## 2018-10-26 ENCOUNTER — Encounter: Payer: Self-pay | Admitting: Psychiatry

## 2018-10-26 DIAGNOSIS — F411 Generalized anxiety disorder: Secondary | ICD-10-CM

## 2018-10-26 NOTE — Progress Notes (Signed)
      [facility] Counselor/Therapist Progress Note  Patient ID: Mark Mckenzie, MRN: [id number],    Date: 10/26/2018  Time Spent: 51 minutes    Treatment Type: Individual Therapy  Reported Symptoms: Anxious Mood  Mental Status Exam:  Appearance:   Casual and Well Groomed     Behavior:  Appropriate  Motor:  Normal  Speech/Language:   Clear and Coherent  Affect:  Appropriate  Mood:  anxious  Thought process:  normal  Thought content:    WNL  Sensory/Perceptual disturbances:    WNL  Orientation:  oriented to person, place, time/date and situation  Attention:  Good  Concentration:  Good  Memory:  WNL  Fund of knowledge:   Good  Insight:    Good  Judgment:   Good  Impulse Control:  Good   Risk Assessment: Danger to Self:  No Self-injurious Behavior: No Danger to Others: No Duty to Warn:no Physical Aggression / Violence:No  Access to Firearms a concern: No  Gang Involvement:No   Subjective: Client complains that he is having difficulty getting up in the morning he notices some anxiety with this.  He notices he is eating too many carbohydrates and probably not drinking enough water he is also concerned that he is not doing a good job leading his family.  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  We did discuss that he Mark Mckenzie want to cut down on his highly processed carbohydrate intake.  Some of this Mark Mckenzie contribute to his sleep issues.  I also discussed him taking a multivitamin and making sure that he had not B12 in his system.  The client agrees he continues to work towards integrating regular exercise He agrees that he is to selfish with his wife letting her get up and do things in the middle night.  He feels some guilt with this.  We discussed using mood independent behavior to begin to discipline himself to do the things that he is not interested in doing.  I tied this into the clients faith which he found very helpful.  Interventions:  Solution-Oriented/Positive Psychology and Insight-Oriented  Diagnosis:   ICD-10-CM   1. Generalized anxiety disorder F41.1     Plan: mood independent behavior, acceptance.  Mark Mckenzie Mark Mckenzie, [place]

## 2018-12-18 ENCOUNTER — Ambulatory Visit: Payer: BLUE CROSS/BLUE SHIELD | Admitting: Psychiatry

## 2019-01-25 ENCOUNTER — Ambulatory Visit (INDEPENDENT_AMBULATORY_CARE_PROVIDER_SITE_OTHER): Payer: BLUE CROSS/BLUE SHIELD | Admitting: Psychiatry

## 2019-01-25 ENCOUNTER — Other Ambulatory Visit: Payer: Self-pay

## 2019-01-25 ENCOUNTER — Encounter: Payer: Self-pay | Admitting: Psychiatry

## 2019-01-25 DIAGNOSIS — F4321 Adjustment disorder with depressed mood: Secondary | ICD-10-CM | POA: Diagnosis not present

## 2019-01-25 NOTE — Progress Notes (Signed)
[facility] Counselor/Therapist Progress Note  Patient ID: Mark Mckenzie, MRN: [id number],    Date: 01/25/2019  Time Spent: 58 minutes   Treatment Type: Individual Therapy  Reported Symptoms: irritability  Mental Status Exam:  Appearance:   Casual     Behavior:  Appropriate  Motor:  Normal  Speech/Language:   Clear and Coherent  Affect:  Appropriate  Mood:  irritable  Thought process:  normal  Thought content:    WNL  Sensory/Perceptual disturbances:    WNL  Orientation:  oriented to person, place, time/date and situation  Attention:  Good  Concentration:  Good  Memory:  WNL  Fund of knowledge:   Good  Insight:    Good  Judgment:   Good  Impulse Control:  Good   Risk Assessment: Danger to Self:  No Self-injurious Behavior: No Danger to Others: No Duty to Warn:no Physical Aggression / Violence:No  Access to Firearms a concern: No  Gang Involvement:No   Subjective: Telehealth visit I connected with patient by a video enabled telemedicine/telehealth application or telephone, with his informed consent, and verified patient privacy and that I am speaking with the correct person using two identifiers.  I was located at my office and patient at his home.  We discussed the limitations, risks, and security and privacy concerns associated with telehealth services and the availability of in-person appointments, including awareness that he Mark Mckenzie be responsible for charges related to the service, and he expressed understanding and agreed to proceed.  I discussed treatment planning with him, with opportunity to ask and answer all questions. Agreed with the plan, demonstrated an understanding of the instructions, and made him aware to call our office if symptoms worsen or he feels he is in a crisis state and needs immediate contact.  The client reports that he has been "cranky" recently.  He finds that he becomes very easily irritated with his mother.  He treats her poorly and  seems to not be able to control himself.  We discussed this at length about why this Mark Mckenzie be.  He sees his mom as [name] or acting in ways that embarrass him.  I asked the client directly if he needed to forgive his mother to begin to move on.  He did not think so. We discussed how this is not appropriate.  We discussed this in light of his Christianity since he takes that very seriously.  He agrees that he is inconsistent and needs to change this.  We talked about finding appropriate scripture that speak to him treating not only his mother better but not judging others unfairly.  He agrees that he can do this and will work on accomplishing this. I talked with the client that he would have to be very intentional about these things.  Writing down the scriptures that work for him.  Also possibly stringing them together to make a prayer that he could focus on.  He agreed to do so.  Interventions: Cognitive Behavioral Therapy, Solution-Oriented/Positive Psychology and Insight-Oriented  Diagnosis:   ICD-10-CM   1. Adjustment disorder with depressed mood F43.21     Plan: Intentionality, boundaries, finding scriptural references that reflect on how to treat others better than himself.  This record has been created using [employer].  Chart creation errors have been sought, but Mark Mckenzie not always have been located and corrected. Such creation errors do not reflect on the standard of medical care.  Mark Mckenzie, [facility]

## 2019-02-19 ENCOUNTER — Other Ambulatory Visit: Payer: Self-pay | Admitting: Family Medicine

## 2019-03-05 ENCOUNTER — Other Ambulatory Visit: Payer: Self-pay

## 2019-03-05 ENCOUNTER — Ambulatory Visit: Payer: BC Managed Care – PPO | Admitting: Psychiatry

## 2019-03-05 ENCOUNTER — Encounter: Payer: Self-pay | Admitting: Psychiatry

## 2019-03-05 DIAGNOSIS — F4321 Adjustment disorder with depressed mood: Secondary | ICD-10-CM | POA: Diagnosis not present

## 2019-03-05 NOTE — Progress Notes (Signed)
      [facility] Counselor/Therapist Progress Note  Patient ID: Mark Mckenzie, MRN: [id number],    Date: 03/05/2019  Time Spent: 50 minutes   Treatment Type: Individual Therapy  Reported Symptoms: anger  Mental Status Exam:  Appearance:   Casual and Well Groomed     Behavior:  Appropriate  Motor:  Normal  Speech/Language:   Clear and Coherent  Affect:  Appropriate  Mood:  angry  Thought process:  normal  Thought content:    WNL  Sensory/Perceptual disturbances:    WNL  Orientation:  oriented to person, place, time/date and situation  Attention:  Good  Concentration:  Good  Memory:  WNL  Fund of knowledge:   Good  Insight:    Good  Judgment:   Good  Impulse Control:  Good   Risk Assessment: Danger to Self:  No Self-injurious Behavior: No Danger to Others: No Duty to Warn:no Physical Aggression / Violence:No  Access to Firearms a concern: No  Gang Involvement:No   Subjective: I met with the client face-to-face.  We both had facemasks. "I did memorize some scripture to help with my irritability."  "I recently had an episode of rage with my wife that really bothered me."  The client describes an interaction with his wife in their kitchen.  Their dog had thrown up a rag on the floor and the client used up a pair of grilling tongs to pick it up and throw it away.  He set the tongs on his sons highchair food tray with the business end off of the tray.  His wife saw it and got angry with him for putting it on the food tray.  He states he tried to reason with his wife but he felt like she continue to provoke him.  He finally got to a point where he raised his voice and became very angry.  No violence occurred but he was disturbed by how angry he got. I went over the anger clarification model with the client.  I went through the steps to be able to identify the behaviors, metamessages, and then asked for clarification.  As the client reasoned through each step it made logical  sense to him as a way to address their conflicts.  I also encouraged the client to disengage when he becomes very angry and take a walk or go to the other room.  He agrees that would be wise.  I also suggested that he Gaelen Brager at times want to write out what he is feeling as a way of identifying those negative cognitions more effectively.  I gave the client a handout to review. I also encouraged the client to work out more frequently especially using cardiovascular exercise.  Interventions: Assertiveness/Communication, Motivational Interviewing, Solution-Oriented/Positive Psychology and Insight-Oriented  Diagnosis:   ICD-10-CM   1. Adjustment disorder with depressed mood  F43.21     Plan: anger model, exercise, writing.  This record has been created using [employer].  Chart creation errors have been sought, but Desjuan Stearns not always have been located and corrected. Such creation errors do not reflect on the standard of medical care.  Evalyn Shultis, [facility]

## 2019-03-10 ENCOUNTER — Telehealth: Payer: Self-pay | Admitting: Family Medicine

## 2019-03-10 NOTE — Telephone Encounter (Signed)
Refill once.  May set up Doxy before further refills since > one year since follow up.

## 2019-03-10 NOTE — Telephone Encounter (Signed)
Medication Refill - Medication: sertraline (ZOLOFT) 100 MG tablet  Has the patient contacted their pharmacy? sertraline (ZOLOFT) 100 MG tablet (Agent: If no, request that the patient contact the pharmacy for the refill.) (Agent: If yes, when and what did the pharmacy advise?)  Preferred Pharmacy (with phone number or street name):  [id number] - [place], [place] [address] AT [place] [phone] (Phone) [phone] (Fax)     Agent: Please be advised that RX refills may take up to 3 business days. We ask that you follow-up with your pharmacy.

## 2019-03-11 ENCOUNTER — Other Ambulatory Visit: Payer: Self-pay | Admitting: *Deleted

## 2019-03-11 DIAGNOSIS — F410 Panic disorder [episodic paroxysmal anxiety] without agoraphobia: Secondary | ICD-10-CM

## 2019-03-11 MED ORDER — SERTRALINE HCL 100 MG PO TABS: 100.0000 mg | ORAL_TABLET | Freq: Every day | ORAL | 2 refills | Status: DC

## 2019-03-11 NOTE — Telephone Encounter (Signed)
Rx refilled once.  Patient has physical scheduled for 03/26/2019

## 2019-03-12 ENCOUNTER — Other Ambulatory Visit: Payer: Self-pay | Admitting: Family Medicine

## 2019-03-17 ENCOUNTER — Other Ambulatory Visit: Payer: Self-pay

## 2019-03-17 ENCOUNTER — Ambulatory Visit (INDEPENDENT_AMBULATORY_CARE_PROVIDER_SITE_OTHER): Payer: BC Managed Care – PPO | Admitting: Family Medicine

## 2019-03-17 DIAGNOSIS — H109 Unspecified conjunctivitis: Secondary | ICD-10-CM

## 2019-03-17 MED ORDER — POLYMYXIN B-TRIMETHOPRIM 10000-0.1 UNIT/ML-% OP SOLN: 2.0000 [drp] | OPHTHALMIC | 0 refills | Status: DC

## 2019-03-17 NOTE — Progress Notes (Signed)
Patient ID: Mark Mckenzie, male   DOB: Dec 13, 1990, 28 y.o.   MRN: [id number]  This visit type was conducted due to national recommendations for restrictions regarding the COVID-19 pandemic in an effort to limit this patient's exposure and mitigate transmission in our community.   Virtual Visit via Video Note  I connected with Mark Mckenzie on 03/17/19 at  8:45 AM EDT by a video enabled telemedicine application and verified that I am speaking with the correct person using two identifiers.  Location patient: home Location provider:work or home office Persons participating in the virtual visit: patient, provider  I discussed the limitations of evaluation and management by telemedicine and the availability of in person appointments. The patient expressed understanding and agreed to proceed.   HPI:  Patient relates 1 day history of some increased redness of the left eye associated with some thick crusted drainage.  No right symptoms.  Denies any recent sinusitis symptoms.  No blurred vision.  No eye pain.  He does wear contacts but is left these out with current infection.  He has some mild itching.  No known drug allergies   ROS: See pertinent positives and negatives per HPI.  Past Medical History:  Diagnosis Date  . Acne   . TB lung, latent     No past surgical history on file.  Family History  Problem Relation Age of Onset  . Depression Other   . Neurologic Disorder Other   . 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   Current Outpatient Medications:  .  acyclovir (ZOVIRAX) 800 MG tablet, TAKE 1 TABLET BY MOUTH THREE TIMES A DAY IF NEEDED FOR OUTBREAKS, Disp: 90 tablet, Rfl: 0 .  ibuprofen (ADVIL,MOTRIN) 200 MG tablet, Take 400-600 mg by mouth every 6 (six) hours as needed (for pain)., Disp: , Rfl:  .  sertraline (ZOLOFT) 100 MG tablet, Take 1 tablet (100 mg total) by mouth daily., Disp: 90 tablet, Rfl:  2 .  trimethoprim-polymyxin b (POLYTRIM) ophthalmic solution, Place 2 drops into the left eye every 4 (four) hours., Disp: 10 mL, Rfl: 0  EXAM:  VITALS per patient if applicable:  GENERAL: alert, oriented, appears well and in no acute distress  HEENT: atraumatic, conjunttiva clear, no obvious abnormalities on inspection of external nose and ears  NECK: normal movements of the head and neck  LUNGS: on inspection no signs of respiratory distress, breathing rate appears normal, no obvious gross SOB, gasping or wheezing  CV: no obvious cyanosis  MS: moves all visible extremities without noticeable abnormality  PSYCH/NEURO: pleasant and cooperative, no obvious depression or anxiety, speech and thought processing grossly intact  ASSESSMENT AND PLAN:  Discussed the following assessment and plan:  Bacterial conjunctivitis of left eye   -Continue warm compresses several times daily -Polytrim ophthalmic drops 2 drops left eye every 4 hours while awake in touch base in 3 or 4 days if not improving -He will leave out contacts until current infection has resolved     I discussed the assessment and treatment plan with the patient. The patient was provided an opportunity to ask questions and all were answered. The patient agreed with the plan and demonstrated an understanding of the instructions.   The patient was advised to call back or seek an in-person evaluation if the symptoms worsen or if the condition fails to improve as anticipated.   Darnell Level  Elease Hashimoto, MD

## 2019-03-26 ENCOUNTER — Ambulatory Visit (INDEPENDENT_AMBULATORY_CARE_PROVIDER_SITE_OTHER): Payer: BC Managed Care – PPO | Admitting: Family Medicine

## 2019-03-26 ENCOUNTER — Encounter: Payer: Self-pay | Admitting: Family Medicine

## 2019-03-26 ENCOUNTER — Other Ambulatory Visit: Payer: Self-pay

## 2019-03-26 VITALS — BP 120/78 | HR 69 | Temp 98.7°F | Ht 73.5 in | Wt 198.2 lb

## 2019-03-26 DIAGNOSIS — Z13220 Encounter for screening for lipoid disorders: Secondary | ICD-10-CM | POA: Diagnosis not present

## 2019-03-26 DIAGNOSIS — Z0000 Encounter for general adult medical examination without abnormal findings: Secondary | ICD-10-CM

## 2019-03-26 NOTE — Progress Notes (Signed)
Subjective:     Patient ID: Mark Mckenzie, male   DOB: 10-21-1990, 28 y.o.   MRN: [id number]  HPI   Patient seen for physical exam.  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  He has some allergies specifically to things like cats.  He takes Zyrtec.  Still has occasional breakthrough symptoms.  He has been on Zoloft 100 mg daily for several years for some anxiety symptoms.  He would like to consider reducing this to 50 mg.  He has small cystic swelling just superior to the right testicle.  This been present for several years.  He seen urologist previously.  Was told this was benign.  No recent change in size.  Tetanus up-to-date.  Patient exercises regularly.  He is married.  Non-smoker.  Past Medical History:  Diagnosis Date  . Acne   . TB lung, latent    History reviewed. No pertinent surgical history.  reports that he has been smoking pipe. He has never used smokeless tobacco. He reports current alcohol use. He reports that he does not use drugs. family history includes Depression in an other family member; Diabetes in his father; Neurologic Disorder in an other family member. Allergies  Allergen Reactions  . Dust Mite Extract     Pollen, animal dander     Review of Systems  Constitutional: Negative for activity change, appetite change, fatigue and fever.  HENT: Negative for congestion, ear pain and trouble swallowing.   Eyes: Negative for pain and visual disturbance.  Respiratory: Negative for cough, shortness of breath and wheezing.   Cardiovascular: Negative for chest pain and palpitations.  Gastrointestinal: Negative for abdominal distention, abdominal pain, blood in stool, constipation, diarrhea, nausea, rectal pain and vomiting.  Genitourinary: Negative for dysuria and hematuria.  Musculoskeletal: Negative for arthralgias and joint swelling.  Skin: Negative for rash.  Neurological: Negative for dizziness, syncope and headaches.  Hematological:  Negative for adenopathy.  Psychiatric/Behavioral: Negative for confusion and dysphoric mood.       Objective:   Physical Exam Constitutional:      General: He is not in acute distress.    Appearance: He is well-developed.  HENT:     Head: Normocephalic and atraumatic.     Right Ear: External ear normal.     Left Ear: External ear normal.  Eyes:     Conjunctiva/sclera: Conjunctivae normal.     Pupils: Pupils are equal, round, and reactive to light.  Neck:     Musculoskeletal: Normal range of motion and neck supple.     Thyroid: No thyromegaly.  Cardiovascular:     Rate and Rhythm: Normal rate and regular rhythm.     Heart sounds: Normal heart sounds. No murmur.  Pulmonary:     Effort: No respiratory distress.     Breath sounds: No wheezing or rales.  Abdominal:     General: Bowel sounds are normal. There is no distension.     Palpations: Abdomen is soft. There is no mass.     Tenderness: There is no abdominal tenderness. There is no guarding or rebound.  Genitourinary:    Comments: Testes are normal.  Just superior to the right testicle he has approximately 1/2 cm cystic mobile nontender lesion.  Patient states this is unchanged for several years Lymphadenopathy:     Cervical: No cervical adenopathy.  Skin:    Findings: No rash.  Neurological:     Mental Status: He is alert and oriented to person, place, and  time.     Cranial Nerves: No cranial nerve deficit.     Deep Tendon Reflexes: Reflexes normal.        Assessment:     Physical exam.  Generally healthy 28 year old male.  He has history of panic disorder symptoms which are currently stable on sertraline.  History of mild hyperlipidemia    Plan:     -Recheck labs -Consider reducing sertraline to 50 mg daily.  Touch base if any breakthrough anxiety symptoms -Consider adding Flonase to Zyrtec for any breakthrough allergy symptoms  Kristian CoveyBruce W Shelsey Rieth MD [facility]

## 2019-03-27 LAB — BASIC METABOLIC PANEL
BUN: 15 mg/dL (ref 7–25)
CO2: 31 mmol/L (ref 20–32)
Calcium: 9.9 mg/dL (ref 8.6–10.3)
Chloride: 103 mmol/L (ref 98–110)
Creat: 1.12 mg/dL (ref 0.60–1.35)
Glucose, Bld: 92 mg/dL (ref 65–99)
Potassium: 4.4 mmol/L (ref 3.5–5.3)
Sodium: 140 mmol/L (ref 135–146)

## 2019-03-27 LAB — CBC WITH DIFFERENTIAL/PLATELET
Absolute Monocytes: 397 cells/uL (ref 200–950)
Basophils Absolute: 43 cells/uL (ref 0–200)
Basophils Relative: 0.7 %
Eosinophils Absolute: 281 cells/uL (ref 15–500)
Eosinophils Relative: 4.6 %
HCT: 47.4 % (ref 38.5–50.0)
Hemoglobin: 16.2 g/dL (ref 13.2–17.1)
Lymphs Abs: 2495 cells/uL (ref 850–3900)
MCH: 29.1 pg (ref 27.0–33.0)
MCHC: 34.2 g/dL (ref 32.0–36.0)
MCV: 85.3 fL (ref 80.0–100.0)
MPV: 9.5 fL (ref 7.5–12.5)
Monocytes Relative: 6.5 %
Neutro Abs: 2885 cells/uL (ref 1500–7800)
Neutrophils Relative %: 47.3 %
Platelets: 295 10*3/uL (ref 140–400)
RBC: 5.56 10*6/uL (ref 4.20–5.80)
RDW: 12.2 % (ref 11.0–15.0)
Total Lymphocyte: 40.9 %
WBC: 6.1 10*3/uL (ref 3.8–10.8)

## 2019-03-27 LAB — HEPATIC FUNCTION PANEL
AG Ratio: 2.6 (calc) — ABNORMAL HIGH (ref 1.0–2.5)
ALT: 14 U/L (ref 9–46)
AST: 9 U/L — ABNORMAL LOW (ref 10–40)
Albumin: 5.2 g/dL — ABNORMAL HIGH (ref 3.6–5.1)
Alkaline phosphatase (APISO): 64 U/L (ref 36–130)
Bilirubin, Direct: 0.1 mg/dL (ref 0.0–0.2)
Globulin: 2 g/dL (calc) (ref 1.9–3.7)
Indirect Bilirubin: 0.5 mg/dL (calc) (ref 0.2–1.2)
Total Bilirubin: 0.6 mg/dL (ref 0.2–1.2)
Total Protein: 7.2 g/dL (ref 6.1–8.1)

## 2019-03-27 LAB — LIPID PANEL
Cholesterol: 236 mg/dL — ABNORMAL HIGH (ref <200)
HDL: 40 mg/dL (ref >=40)
LDL Cholesterol (Calc): 159 mg/dL (calc) — ABNORMAL HIGH
Non-HDL Cholesterol (Calc): 196 mg/dL (calc) — ABNORMAL HIGH (ref <130)
Total CHOL/HDL Ratio: 5.9 (calc) — ABNORMAL HIGH (ref <5.0)
Triglycerides: 209 mg/dL — ABNORMAL HIGH (ref <150)

## 2019-03-27 LAB — TSH: TSH: 1.49 mIU/L (ref 0.40–4.50)

## 2019-03-28 ENCOUNTER — Encounter: Payer: Self-pay | Admitting: Family Medicine

## 2019-04-29 ENCOUNTER — Ambulatory Visit: Payer: BC Managed Care – PPO | Admitting: Psychiatry

## 2019-05-13 ENCOUNTER — Encounter: Payer: Self-pay | Admitting: Family Medicine

## 2019-06-01 ENCOUNTER — Other Ambulatory Visit: Payer: Self-pay

## 2019-06-01 ENCOUNTER — Encounter: Payer: Self-pay | Admitting: Psychiatry

## 2019-06-01 ENCOUNTER — Ambulatory Visit (INDEPENDENT_AMBULATORY_CARE_PROVIDER_SITE_OTHER): Payer: BC Managed Care – PPO | Admitting: Psychiatry

## 2019-06-01 DIAGNOSIS — F4321 Adjustment disorder with depressed mood: Secondary | ICD-10-CM

## 2019-06-01 NOTE — Progress Notes (Signed)
      [facility] Counselor/Therapist Progress Note  Patient ID: DERRAN SEAR, MRN: [id number],    Date: 06/01/2019  Time Spent: 50 minutes   Treatment Type: Individual Therapy  Reported Symptoms: irritability  Mental Status Exam:  Appearance:   Casual     Behavior:  Appropriate  Motor:  Normal  Speech/Language:   Clear and Coherent  Affect:  Appropriate  Mood:  irritable  Thought process:  normal  Thought content:    WNL  Sensory/Perceptual disturbances:    WNL  Orientation:  oriented to person, place, time/date and situation  Attention:  Good  Concentration:  Good  Memory:  WNL  Fund of knowledge:   Good  Insight:    Good  Judgment:   Good  Impulse Control:  Good   Risk Assessment: Danger to Self:  No Self-injurious Behavior: No Danger to Others: No Duty to Warn:no Physical Aggression / Violence:No  Access to Firearms a concern: No  Gang Involvement:No   Subjective: The client states that he had met with his physician and decided to decrease his Zoloft from 100 mg to 50 mg due to sexual side effects.  He states he has no current anxiety but still suffers from some irritability.  It has not been as bad as it has been in the past.  He has increased his cardiovascular workout to at least 1/2-hour every other day. We discussed his overall irritability and the thought process connected to that.  He realizes that he can be overly critical or judgmental.  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  He knows he judges some of his peers harshly for being immature or overly liberal.  In terms of his faith, the client knows that this is not a good road to go down.  He has a friend who has modeled kind behavior towards others that he admires.  I challenged the client to practice more mood independent behavior and begin to engage with others even if he does not like them.  I also challenged him to look at people as created beings that God wants him to  love in spite of how he feels about them.  The main point is to honor God.  The client agreed and felt this would be a good way of approaching this. With the reduction of his Zoloft I suggested to his client that he Aradhana Gin want to consider l-theanine and 5HTP.  The  l-theanine can help reduce his overall stress and give him more margin when dealing with stressful circumstances.  The 5 HTP will mildly lift his serotonin levels without interfering with his libido.  It will also help cut down on his irritability overall.  The client will look into this.  Interventions: Cognitive Behavioral Therapy, Assertiveness/Communication, Motivational Interviewing, Solution-Oriented/Positive Psychology and Insight-Oriented  Diagnosis:   ICD-10-CM   1. Adjustment disorder with depressed mood  F43.21     Plan: Mood independent behavior, positive self talk, assertiveness, boundaries, self-care, exercise.  Tayson Schnelle, [facility]

## 2019-06-02 ENCOUNTER — Ambulatory Visit: Payer: BC Managed Care – PPO | Admitting: Psychiatry

## 2019-07-14 ENCOUNTER — Ambulatory Visit (INDEPENDENT_AMBULATORY_CARE_PROVIDER_SITE_OTHER): Payer: BC Managed Care – PPO | Admitting: Psychiatry

## 2019-07-14 ENCOUNTER — Other Ambulatory Visit: Payer: Self-pay

## 2019-07-14 ENCOUNTER — Encounter: Payer: Self-pay | Admitting: Psychiatry

## 2019-07-14 DIAGNOSIS — F4321 Adjustment disorder with depressed mood: Secondary | ICD-10-CM | POA: Diagnosis not present

## 2019-07-14 NOTE — Progress Notes (Signed)
      [facility] Counselor/Therapist Progress Note  Patient ID: Mark Mckenzie, MRN: [id number],    Date: 07/14/2019  Time Spent: 50 minutes   Treatment Type: Individual Therapy  Reported Symptoms: irritabilty  Mental Status Exam:  Appearance:   Casual     Behavior:  Appropriate  Motor:  Normal  Speech/Language:   Clear and Coherent  Affect:  Appropriate  Mood:  irritable  Thought process:  normal  Thought content:    WNL  Sensory/Perceptual disturbances:    WNL  Orientation:  oriented to person, place, time/date and situation  Attention:  Good  Concentration:  Good  Memory:  WNL  Fund of knowledge:   Good  Insight:    Good  Judgment:   Good  Impulse Control:  Good   Risk Assessment: Danger to Self:  No Self-injurious Behavior: No Danger to Others: No Duty to Warn:no Physical Aggression / Violence:No  Access to Firearms a concern: No  Gang Involvement:No   Subjective: Client reports that since dropping his Zoloft to 50 mg his side effects have resolved and he has been able to stay anxiety free.  He plans to use the L-theanine to see if he can impact his irritability.  He identified today that he knows that he is unkind to people.  "I am tired of walking around with a scowl on my face."  He states he and his wife have been more testy with each other.  He also finds himself waking up in the middle of the night trying to decide about what fields to specialize in, in medical school. Addressing the client's judgmentalism, I floated the idea that maybe he has developed a defensive narcissism born out of his difficult childhood?  The client was intrigued with the idea.  He agreed that maybe his ego was a little fragile and he compensated by being overly confident.  I pointed out to the client that he is overly critical of himself and that plays out into his other relationships as well.  The client will consider this and begin to evaluate if his expectations are realistic or  not. We also discussed some of the fields the client has considered for medical school.  He is trying to balance the hours per week of work it requires versus the income it produces.  This will be an ongoing decision making process.  I will supply the client with a decision-making worksheet to review. The client will also find 5 biblical versus that address issues around being critical.  He will review these as a way to cognitively restructure his thought process.  He agreed to do so.  Interventions: Cognitive Behavioral Therapy, Motivational Interviewing, Solution-Oriented/Positive Psychology, Psycho-education/Bibliotherapy and Insight-Oriented  Diagnosis:   ICD-10-CM   1. Adjustment disorder with depressed mood  F43.21     Plan: Mood independent behavior, decision-making process, cognitive restructuring, boundaries.  Kortlynn Poust, [facility]

## 2019-07-20 DIAGNOSIS — L218 Other seborrheic dermatitis: Secondary | ICD-10-CM | POA: Diagnosis not present

## 2019-07-20 DIAGNOSIS — D2261 Melanocytic nevi of right upper limb, including shoulder: Secondary | ICD-10-CM | POA: Diagnosis not present

## 2019-07-20 DIAGNOSIS — D2262 Melanocytic nevi of left upper limb, including shoulder: Secondary | ICD-10-CM | POA: Diagnosis not present

## 2019-07-20 DIAGNOSIS — D225 Melanocytic nevi of trunk: Secondary | ICD-10-CM | POA: Diagnosis not present

## 2019-08-11 ENCOUNTER — Ambulatory Visit (INDEPENDENT_AMBULATORY_CARE_PROVIDER_SITE_OTHER): Payer: BC Managed Care – PPO | Admitting: Psychiatry

## 2019-08-11 ENCOUNTER — Other Ambulatory Visit: Payer: Self-pay

## 2019-08-11 ENCOUNTER — Encounter: Payer: Self-pay | Admitting: Psychiatry

## 2019-08-11 DIAGNOSIS — F4321 Adjustment disorder with depressed mood: Secondary | ICD-10-CM | POA: Diagnosis not present

## 2019-08-11 NOTE — Progress Notes (Signed)
      [facility] Counselor/Therapist Progress Note  Patient ID: Mark Mckenzie, MRN: [id number],    Date: 08/11/2019  Time Spent: 50 minutes   Treatment Type: Individual Therapy  Reported Symptoms: irritable  Mental Status Exam:  Appearance:   Casual     Behavior:  Appropriate  Motor:  Normal  Speech/Language:   Clear and Coherent  Affect:  Appropriate  Mood:  irritable  Thought process:  normal  Thought content:    WNL  Sensory/Perceptual disturbances:    WNL  Orientation:  oriented to person, place, time/date and situation  Attention:  Good  Concentration:  Good  Memory:  WNL  Fund of knowledge:   Good  Insight:    Good  Judgment:   Good  Impulse Control:  Good   Risk Assessment: Danger to Self:  No Self-injurious Behavior: No Danger to Others: No Duty to Warn:no Physical Aggression / Violence:No  Access to Firearms a concern: No  Gang Involvement:No   Subjective: The client states he has not used the decision making form yet and plans to review it over the Christmas break.  He states he is studying diligently for his first board exam.  His score on this exam has an impact on what specialty he will be able to apply for. His concern going forward has to do with the ability to apply the knowledge he has gained in a clinical setting.  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  He does not want it interferes significantly with his relationship with his son. We discussed what it would look like to set boundaries.  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  He is reluctant to take medications which is why he reduced his Zoloft.  He does find that he tends to ruminate more.  He notes his irritability has not been quite as bad.  He has been memorizing some biblical versus about controlling the anger which he states has been helpful.  He will discuss the ADHD medicine with his primary care  physician.  He continues to exercise regularly.  We also discussed good sleep hygiene.  Interventions: Assertiveness/Communication, Motivational Interviewing, Solution-Oriented/Positive Psychology, [facility] Desensitization and Reprocessing (EMDR) and Insight-Oriented  Diagnosis:   ICD-10-CM   1. Adjustment disorder with depressed mood  F43.21     Plan: Meet with primary care physician, boundaries, self-care, exercise, sleep hygiene, assertiveness, positive self talk.  Mark Mckenzie, [facility]

## 2019-09-01 DIAGNOSIS — Z20828 Contact with and (suspected) exposure to other viral communicable diseases: Secondary | ICD-10-CM | POA: Diagnosis not present

## 2019-09-01 DIAGNOSIS — J3489 Other specified disorders of nose and nasal sinuses: Secondary | ICD-10-CM | POA: Diagnosis not present

## 2019-09-16 ENCOUNTER — Ambulatory Visit: Payer: BC Managed Care – PPO | Admitting: Psychiatry

## 2020-01-17 DIAGNOSIS — Z23 Encounter for immunization: Secondary | ICD-10-CM | POA: Diagnosis not present

## 2020-02-08 DIAGNOSIS — Z20822 Contact with and (suspected) exposure to covid-19: Secondary | ICD-10-CM | POA: Diagnosis not present

## 2020-02-08 DIAGNOSIS — M791 Myalgia, unspecified site: Secondary | ICD-10-CM | POA: Diagnosis not present

## 2020-02-19 DIAGNOSIS — Z23 Encounter for immunization: Secondary | ICD-10-CM | POA: Diagnosis not present

## 2020-03-02 ENCOUNTER — Encounter: Payer: Self-pay | Admitting: Family Medicine

## 2020-04-05 DIAGNOSIS — R05 Cough: Secondary | ICD-10-CM | POA: Diagnosis not present

## 2020-04-05 DIAGNOSIS — Z20822 Contact with and (suspected) exposure to covid-19: Secondary | ICD-10-CM | POA: Diagnosis not present

## 2020-04-11 ENCOUNTER — Other Ambulatory Visit: Payer: Self-pay | Admitting: Family Medicine

## 2020-04-11 DIAGNOSIS — F410 Panic disorder [episodic paroxysmal anxiety] without agoraphobia: Secondary | ICD-10-CM

## 2020-04-13 ENCOUNTER — Telehealth: Payer: Self-pay | Admitting: Family Medicine

## 2020-04-13 NOTE — Telephone Encounter (Signed)
error 

## 2020-09-05 ENCOUNTER — Other Ambulatory Visit: Payer: Self-pay

## 2020-09-05 ENCOUNTER — Encounter: Payer: Self-pay | Admitting: Family Medicine

## 2020-09-05 ENCOUNTER — Ambulatory Visit: Payer: BC Managed Care – PPO | Admitting: Family Medicine

## 2020-09-05 VITALS — BP 120/80 | HR 79 | Ht 73.5 in | Wt 197.0 lb

## 2020-09-05 DIAGNOSIS — F410 Panic disorder [episodic paroxysmal anxiety] without agoraphobia: Secondary | ICD-10-CM | POA: Diagnosis not present

## 2020-09-05 DIAGNOSIS — F411 Generalized anxiety disorder: Secondary | ICD-10-CM | POA: Insufficient documentation

## 2020-09-05 DIAGNOSIS — Z0000 Encounter for general adult medical examination without abnormal findings: Secondary | ICD-10-CM

## 2020-09-05 DIAGNOSIS — N442 Benign cyst of testis: Secondary | ICD-10-CM | POA: Insufficient documentation

## 2020-09-05 MED ORDER — SERTRALINE HCL 100 MG PO TABS: 100.0000 mg | ORAL_TABLET | Freq: Every day | ORAL | 2 refills | Status: DC

## 2020-09-05 MED ORDER — VALACYCLOVIR HCL 1 G PO TABS: ORAL_TABLET | ORAL | 5 refills | Status: DC

## 2020-09-05 NOTE — Patient Instructions (Signed)
Preventive Care 19-29 Years Old, Male Preventive care refers to lifestyle choices and visits with your health care provider that can promote health and wellness. This includes:  A yearly physical exam. This is also called an annual well check.  Regular dental and eye exams.  Immunizations.  Screening for certain conditions.  Healthy lifestyle choices, such as eating a healthy diet, getting regular exercise, not using drugs or products that contain nicotine and tobacco, and limiting alcohol use. What can I expect for my preventive care visit? Physical exam Your health care provider will check:  Height and weight. These may be used to calculate body mass index (BMI), which is a measurement that tells if you are at a healthy weight.  Heart rate and blood pressure.  Your skin for abnormal spots. Counseling Your health care provider may ask you questions about:  Alcohol, tobacco, and drug use.  Emotional well-being.  Home and relationship well-being.  Sexual activity.  Eating habits.  Work and work Statistician. What immunizations do I need?  Influenza (flu) vaccine  This is recommended every year. Tetanus, diphtheria, and pertussis (Tdap) vaccine  You may need a Td booster every 10 years. Varicella (chickenpox) vaccine  You may need this vaccine if you have not already been vaccinated. Human papillomavirus (HPV) vaccine  If recommended by your health care provider, you may need three doses over 6 months. Measles, mumps, and rubella (MMR) vaccine  You may need at least one dose of MMR. You may also need a second dose. Meningococcal conjugate (MenACWY) vaccine  One dose is recommended if you are 45-76 years old and a Market researcher living in a residence hall, or if you have one of several medical conditions. You may also need additional booster doses. Pneumococcal conjugate (PCV13) vaccine  You may need this if you have certain conditions and were not  previously vaccinated. Pneumococcal polysaccharide (PPSV23) vaccine  You may need one or two doses if you smoke cigarettes or if you have certain conditions. Hepatitis A vaccine  You may need this if you have certain conditions or if you travel or work in places where you may be exposed to hepatitis A. Hepatitis B vaccine  You may need this if you have certain conditions or if you travel or work in places where you may be exposed to hepatitis B. Haemophilus influenzae type b (Hib) vaccine  You may need this if you have certain risk factors. You may receive vaccines as individual doses or as more than one vaccine together in one shot (combination vaccines). Talk with your health care provider about the risks and benefits of combination vaccines. What tests do I need? Blood tests  Lipid and cholesterol levels. These may be checked every 5 years starting at age 17.  Hepatitis C test.  Hepatitis B test. Screening   Diabetes screening. This is done by checking your blood sugar (glucose) after you have not eaten for a while (fasting).  Sexually transmitted disease (STD) testing. Talk with your health care provider about your test results, treatment options, and if necessary, the need for more tests. Follow these instructions at home: Eating and drinking   Eat a diet that includes fresh fruits and vegetables, whole grains, lean protein, and low-fat dairy products.  Take vitamin and mineral supplements as recommended by your health care provider.  Do not drink alcohol if your health care provider tells you not to drink.  If you drink alcohol: ? Limit how much you have to 0-2  drinks a day. ? Be aware of how much alcohol is in your drink. In the [place] one drink equals one 12 oz bottle of beer (355 mL), one 5 oz glass of wine (148 mL), or one 1 oz glass of hard liquor (44 mL). Lifestyle  Take daily care of your teeth and gums.  Stay active. Exercise for at least 30 minutes on 5 or  more days each week.  Do not use any products that contain nicotine or tobacco, such as cigarettes, e-cigarettes, and chewing tobacco. If you need help quitting, ask your health care provider.  If you are sexually active, practice safe sex. Use a condom or other form of protection to prevent STIs (sexually transmitted infections). What's next?  Go to your health care provider once a year for a well check visit.  Ask your health care provider how often you should have your eyes and teeth checked.  Stay up to date on all vaccines. This information is not intended to replace advice given to you by your health care provider. Make sure you discuss any questions you have with your health care provider. Document Revised: 08/27/2018 Document Reviewed: 08/27/2018 Elsevier Patient Education  2020 [employer].

## 2020-09-05 NOTE — Progress Notes (Signed)
Established Patient Office Visit  Subjective:  Patient ID: Mark Mckenzie, male    DOB: 21-Jan-1991  Age: 29 y.o. MRN: [id number]  CC:  Chief Complaint  Patient presents with  . Annual Exam    HPI Mark Mckenzie presents for physical exam.  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  He is considering going into either dermatology or radiology.  He and his wife have a 12-year-old son and are expecting child August 5.  He states that school is going generally very well.  He is currently on Zoloft 50 mg daily.  He is taking this mostly for anxiety symptoms in the past and was able to taper this down from 100 mg to 50 mg and doing fairly well.  Exercises regularly.  Health maintenance reviewed  -Flu vaccine already given -Covid vaccines given -Tetanus due 2023 -No history of hepatitis C screening but low risk.  Social history-as above.  He is married.  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  76-year-old son and second child on the way in August.  Non-smoker.  Family history-mother had breast cancer and also hyperlipidemia history.  His father had questionable prediabetes versus type 2 diabetes and heart disease but was in his 70s.  Maternal uncle with prostate cancer  Past Medical History:  Diagnosis Date  . Acne   . TB lung, latent     No past surgical history on file.  Family History  Problem Relation Age of Onset  . Depression Other   . Neurologic Disorder Other   . Diabetes Father        type 2  . Heart disease Father 83       CAD  . Hyperlipidemia Mother   . Cancer Mother 85       breast  . Cancer Maternal Uncle        prostate cancer    Social History   Socioeconomic History  . Marital status: Single    Spouse name: Not on file  . Number of children: Not on file  . Years of education: Not on file  . Highest education level: Not on file  Occupational History  . Not on file  Tobacco Use  . Smoking status: Light Tobacco  Smoker    Types: Pipe  . Smokeless tobacco: Never Used  Vaping Use  . Vaping Use: Never used  Substance and Sexual Activity  . Alcohol use: Yes    Comment: rare  . Drug use: No  . Sexual activity: Not on file  Other Topics Concern  . Not on file  Social History Narrative  . Not on file   Social Determinants of Health   Financial Resource Strain: Not on file  Food Insecurity: Not on file  Transportation Needs: Not on file  Physical Activity: Not on file  Stress: Not on file  Social Connections: Not on file  Intimate Partner Violence: Not on file    Outpatient Medications Prior to Visit  Medication Sig Dispense Refill  . ibuprofen (ADVIL,MOTRIN) 200 MG tablet Take 400-600 mg by mouth every 6 (six) hours as needed (for pain).    Marland Kitchen trimethoprim-polymyxin b (POLYTRIM) ophthalmic solution Place 2 drops into the left eye every 4 (four) hours. (Patient not taking: Reported on 03/26/2019) 10 mL 0  . acyclovir (ZOVIRAX) 800 MG tablet TAKE 1 TABLET BY MOUTH THREE TIMES A DAY IF NEEDED FOR OUTBREAKS 90 tablet 0  . sertraline (ZOLOFT) 100 MG  tablet Take 1 tablet (100 mg total) by mouth daily. 90 tablet 2   No facility-administered medications prior to visit.    Allergies  Allergen Reactions  . Dust Mite Extract     Pollen, animal dander    ROS Review of Systems  Constitutional: Negative for activity change, appetite change, chills, fatigue, fever and unexpected weight change.  HENT: Negative for congestion, ear pain and trouble swallowing.   Eyes: Negative for pain and visual disturbance.  Respiratory: Negative for cough, shortness of breath and wheezing.   Cardiovascular: Negative for chest pain and palpitations.  Gastrointestinal: Negative for abdominal distention, abdominal pain, blood in stool, constipation, diarrhea, nausea, rectal pain and vomiting.  Genitourinary: Negative for dysuria and hematuria.  Musculoskeletal: Negative for arthralgias and joint swelling.   Neurological: Negative for dizziness, syncope and headaches.  Hematological: Negative for adenopathy.  Psychiatric/Behavioral: Negative for confusion and dysphoric mood.      Objective:    Physical Exam Constitutional:      General: He is not in acute distress.    Appearance: He is well-developed and well-nourished.  HENT:     Head: Normocephalic and atraumatic.     Right Ear: External ear normal.     Left Ear: External ear normal.     Mouth/Throat:     Mouth: Oropharynx is clear and moist.  Eyes:     Extraocular Movements: EOM normal.     Conjunctiva/sclera: Conjunctivae normal.     Pupils: Pupils are equal, round, and reactive to light.  Neck:     Thyroid: No thyromegaly.  Cardiovascular:     Rate and Rhythm: Normal rate and regular rhythm.     Heart sounds: Normal heart sounds. No murmur heard.   Pulmonary:     Effort: No respiratory distress.     Breath sounds: No wheezing or rales.  Abdominal:     General: Bowel sounds are normal. There is no distension.     Palpations: Abdomen is soft. There is no mass.     Tenderness: There is no abdominal tenderness. There is no guarding or rebound.  Musculoskeletal:        General: No edema.     Cervical back: Normal range of motion and neck supple.     Right lower leg: No edema.     Left lower leg: No edema.  Lymphadenopathy:     Cervical: No cervical adenopathy.  Skin:    Findings: Rash present.     Comments: Mild flaking/scaly rash eyebrow region.   Neurological:     Mental Status: He is alert and oriented to person, place, and time.     Cranial Nerves: No cranial nerve deficit.  Psychiatric:        Mood and Affect: Mood and affect normal.     BP 120/80   Pulse 79   Ht 6' 1.5" (1.867 m)   Wt 197 lb (89.4 kg)   SpO2 97%   BMI 25.64 kg/m  Wt Readings from Last 3 Encounters:  09/05/20 197 lb (89.4 kg)  03/26/19 198 lb 3.2 oz (89.9 kg)  02/13/18 191 lb 3.2 oz (86.7 kg)     Health Maintenance Due  Topic  Date Due  . Hepatitis C Screening  Never done  . HIV Screening  Never done    There are no preventive care reminders to display for this patient.  Lab Results  Component Value Date   TSH 1.49 03/26/2019   Lab Results  Component Value Date  WBC 6.1 03/26/2019   HGB 16.2 03/26/2019   HCT 47.4 03/26/2019   MCV 85.3 03/26/2019   PLT 295 03/26/2019   Lab Results  Component Value Date   NA 140 03/26/2019   K 4.4 03/26/2019   CO2 31 03/26/2019   GLUCOSE 92 03/26/2019   BUN 15 03/26/2019   CREATININE 1.12 03/26/2019   BILITOT 0.6 03/26/2019   ALKPHOS 62 02/12/2017   AST 9 (L) 03/26/2019   ALT 14 03/26/2019   PROT 7.2 03/26/2019   ALBUMIN 4.6 02/12/2017   CALCIUM 9.9 03/26/2019   ANIONGAP 14 03/27/2014   GFR 98.26 02/12/2017   Lab Results  Component Value Date   CHOL 236 (H) 03/26/2019   Lab Results  Component Value Date   HDL 40 03/26/2019   Lab Results  Component Value Date   LDLCALC 159 (H) 03/26/2019   Lab Results  Component Value Date   TRIG 209 (H) 03/26/2019   Lab Results  Component Value Date   CHOLHDL 5.9 (H) 03/26/2019   Lab Results  Component Value Date   HGBA1C 5.4 02/13/2018      Assessment & Plan:   Problem List Items Addressed This Visit      Unprioritized   Panic disorder   Relevant Medications   sertraline (ZOLOFT) 100 MG tablet    Other Visit Diagnoses    Physical exam    -  Primary   Relevant Orders   Basic metabolic panel   Lipid panel   TSH   CBC with Differential/Platelet   Hepatic function panel   Hep C Antibody    -Obtain screening labs as above -Refilled his sertraline for 1 year. -He has had recurrent cold sores and requested refill for valacyclovir take 2 g at onset of cold sore and repeat 2 in 12 hours as needed -immunizations up to date.  Meds ordered this encounter  Medications  . valACYclovir (VALTREX) 1000 MG tablet    Sig: Take two tablets by mouth at onset of cold sore and repeat two tablets in 12  hours    Dispense:  30 tablet    Refill:  5  . sertraline (ZOLOFT) 100 MG tablet    Sig: Take 1 tablet (100 mg total) by mouth daily.    Dispense:  90 tablet    Refill:  2    Follow-up: No follow-ups on file.    Evelena Peat, MD

## 2020-09-06 ENCOUNTER — Other Ambulatory Visit: Payer: BC Managed Care – PPO

## 2020-09-07 ENCOUNTER — Other Ambulatory Visit (INDEPENDENT_AMBULATORY_CARE_PROVIDER_SITE_OTHER): Payer: BC Managed Care – PPO

## 2020-09-07 ENCOUNTER — Other Ambulatory Visit: Payer: Self-pay

## 2020-09-07 DIAGNOSIS — Z0000 Encounter for general adult medical examination without abnormal findings: Secondary | ICD-10-CM | POA: Diagnosis not present

## 2020-09-11 LAB — CBC WITH DIFFERENTIAL/PLATELET
Absolute Monocytes: 308 cells/uL (ref 200–950)
Basophils Absolute: 50 cells/uL (ref 0–200)
Basophils Relative: 0.9 %
Eosinophils Absolute: 231 cells/uL (ref 15–500)
Eosinophils Relative: 4.2 %
HCT: 46.7 % (ref 38.5–50.0)
Hemoglobin: 16.5 g/dL (ref 13.2–17.1)
Lymphs Abs: 2118 cells/uL (ref 850–3900)
MCH: 30.4 pg (ref 27.0–33.0)
MCHC: 35.3 g/dL (ref 32.0–36.0)
MCV: 86 fL (ref 80.0–100.0)
MPV: 9.3 fL (ref 7.5–12.5)
Monocytes Relative: 5.6 %
Neutro Abs: 2794 cells/uL (ref 1500–7800)
Neutrophils Relative %: 50.8 %
Platelets: 269 10*3/uL (ref 140–400)
RBC: 5.43 10*6/uL (ref 4.20–5.80)
RDW: 11.9 % (ref 11.0–15.0)
Total Lymphocyte: 38.5 %
WBC: 5.5 10*3/uL (ref 3.8–10.8)

## 2020-09-11 LAB — LIPID PANEL
Cholesterol: 236 mg/dL — ABNORMAL HIGH (ref <200)
HDL: 50 mg/dL (ref >=40)
LDL Cholesterol (Calc): 163 mg/dL (calc) — ABNORMAL HIGH
Non-HDL Cholesterol (Calc): 186 mg/dL (calc) — ABNORMAL HIGH (ref <130)
Total CHOL/HDL Ratio: 4.7 (calc) (ref <5.0)
Triglycerides: 111 mg/dL (ref <150)

## 2020-09-11 LAB — HEPATIC FUNCTION PANEL
AG Ratio: 2 (calc) (ref 1.0–2.5)
ALT: 16 U/L (ref 9–46)
AST: 9 U/L — ABNORMAL LOW (ref 10–40)
Albumin: 4.7 g/dL (ref 3.6–5.1)
Alkaline phosphatase (APISO): 71 U/L (ref 36–130)
Bilirubin, Direct: 0.1 mg/dL (ref 0.0–0.2)
Globulin: 2.4 g/dL (calc) (ref 1.9–3.7)
Indirect Bilirubin: 0.5 mg/dL (calc) (ref 0.2–1.2)
Total Bilirubin: 0.6 mg/dL (ref 0.2–1.2)
Total Protein: 7.1 g/dL (ref 6.1–8.1)

## 2020-09-11 LAB — BASIC METABOLIC PANEL
BUN: 14 mg/dL (ref 7–25)
CO2: 27 mmol/L (ref 20–32)
Calcium: 9.9 mg/dL (ref 8.6–10.3)
Chloride: 103 mmol/L (ref 98–110)
Creat: 1.02 mg/dL (ref 0.60–1.35)
Glucose, Bld: 99 mg/dL (ref 65–99)
Potassium: 4.8 mmol/L (ref 3.5–5.3)
Sodium: 140 mmol/L (ref 135–146)

## 2020-09-11 LAB — TSH: TSH: 0.93 mIU/L (ref 0.40–4.50)

## 2020-09-11 LAB — HEPATITIS C ANTIBODY
Hepatitis C Ab: NONREACTIVE
SIGNAL TO CUT-OFF: 0.01 (ref <1.00)

## 2020-09-11 NOTE — Telephone Encounter (Signed)
This encounter was created in error - please disregard.

## 2020-09-14 ENCOUNTER — Encounter: Payer: Self-pay | Admitting: Family Medicine

## 2021-09-07 ENCOUNTER — Ambulatory Visit (INDEPENDENT_AMBULATORY_CARE_PROVIDER_SITE_OTHER): Payer: PPO | Admitting: Family Medicine

## 2021-09-07 VITALS — BP 120/70 | HR 99 | Temp 97.7°F | Ht 73.5 in | Wt 195.5 lb

## 2021-09-07 DIAGNOSIS — Z0000 Encounter for general adult medical examination without abnormal findings: Secondary | ICD-10-CM

## 2021-09-07 LAB — BASIC METABOLIC PANEL
BUN: 17 mg/dL (ref 6–23)
CO2: 30 mEq/L (ref 19–32)
Calcium: 9.7 mg/dL (ref 8.4–10.5)
Chloride: 104 mEq/L (ref 96–112)
Creatinine, Ser: 1.12 mg/dL (ref 0.40–1.50)
GFR: 88.11 mL/min (ref >60.00)
Glucose, Bld: 101 mg/dL — ABNORMAL HIGH (ref 70–99)
Potassium: 4.5 mEq/L (ref 3.5–5.1)
Sodium: 141 mEq/L (ref 135–145)

## 2021-09-07 LAB — CBC WITH DIFFERENTIAL/PLATELET
Basophils Absolute: 0 10*3/uL (ref 0.0–0.1)
Basophils Relative: 0.8 % (ref 0.0–3.0)
Eosinophils Absolute: 0.2 10*3/uL (ref 0.0–0.7)
Eosinophils Relative: 3.5 % (ref 0.0–5.0)
HCT: 45.2 % (ref 39.0–52.0)
Hemoglobin: 15.6 g/dL (ref 13.0–17.0)
Lymphocytes Relative: 43 % (ref 12.0–46.0)
Lymphs Abs: 1.9 10*3/uL (ref 0.7–4.0)
MCHC: 34.5 g/dL (ref 30.0–36.0)
MCV: 84.8 fl (ref 78.0–100.0)
Monocytes Absolute: 0.3 10*3/uL (ref 0.1–1.0)
Monocytes Relative: 6.1 % (ref 3.0–12.0)
Neutro Abs: 2 10*3/uL (ref 1.4–7.7)
Neutrophils Relative %: 46.6 % (ref 43.0–77.0)
Platelets: 245 10*3/uL (ref 150.0–400.0)
RBC: 5.33 Mil/uL (ref 4.22–5.81)
RDW: 12.5 % (ref 11.5–15.5)
WBC: 4.4 10*3/uL (ref 4.0–10.5)

## 2021-09-07 LAB — HEPATIC FUNCTION PANEL
ALT: 17 U/L (ref 0–53)
AST: 11 U/L (ref 0–37)
Albumin: 4.7 g/dL (ref 3.5–5.2)
Alkaline Phosphatase: 66 U/L (ref 39–117)
Bilirubin, Direct: 0.1 mg/dL (ref 0.0–0.3)
Total Bilirubin: 0.7 mg/dL (ref 0.2–1.2)
Total Protein: 7.4 g/dL (ref 6.0–8.3)

## 2021-09-07 LAB — LIPID PANEL
Cholesterol: 205 mg/dL — ABNORMAL HIGH (ref 0–200)
HDL: 45 mg/dL (ref >39.00)
LDL Cholesterol: 143 mg/dL — ABNORMAL HIGH (ref 0–99)
NonHDL: 160.39
Total CHOL/HDL Ratio: 5
Triglycerides: 85 mg/dL (ref 0.0–149.0)
VLDL: 17 mg/dL (ref 0.0–40.0)

## 2021-09-07 LAB — TSH: TSH: 0.82 u[IU]/mL (ref 0.35–5.50)

## 2021-09-07 NOTE — Progress Notes (Signed)
Established Patient Office Visit  Subjective:  Patient ID: Mark Mckenzie, male    DOB: 02/12/1991  Age: 30 y.o. MRN: [id number]  CC:  Chief Complaint  Patient presents with   Annual Exam    HPI Mark Mckenzie presents for physical exam.  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  He plans to go into radiology residency.  He tapered himself off sertraline during the past year.  He is generally very healthy.  Recently running about 15 miles per week.  Training for half marathon.  Does have history of hyperlipidemia but overall low risk for CAD.  He and his wife just had their second child back in July  Health maintenance reviewed  -Due for tetanus next year -Had initial COVID vaccines but no booster -Flu vaccine already given  Social history-married with 59-year-old son and 41-month-old daughter.  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  Non-smoker.  Exercises regularly.  Family history-mother had history of breast cancer and hyperlipidemia.  Father had coronary disease early 34s.  No family history of premature CAD.  Maternal uncle with prostate cancer history.  There was some question of his father having type 2 diabetes but he does not think so.   Past Medical History:  Diagnosis Date   Acne    TB lung, latent     No past surgical history on file.  Family History  Problem Relation Age of Onset   Depression Other    Neurologic Disorder Other    Diabetes Father        type 2   Heart disease Father 22       CAD   Hyperlipidemia Mother    Cancer Mother 23       breast   Cancer Maternal Uncle        prostate cancer    Social History   Socioeconomic History   Marital status: Single    Spouse name: Not on file   Number of children: Not on file   Years of education: Not on file   Highest education level: Not on file  Occupational History   Not on file  Tobacco Use    Smoking status: Light Smoker    Types: Pipe   Smokeless tobacco: Never  Vaping Use   Vaping Use: Never used  Substance and Sexual Activity   Alcohol use: Yes    Comment: rare   Drug use: No   Sexual activity: Not on file  Other Topics Concern   Not on file  Social History Narrative   Not on file   Social Determinants of Health   Financial Resource Strain: Not on file  Food Insecurity: Not on file  Transportation Needs: Not on file  Physical Activity: Not on file  Stress: Not on file  Social Connections: Not on file  Intimate Partner Violence: Not on file    Outpatient Medications Prior to Visit  Medication Sig Dispense Refill   ibuprofen (ADVIL,MOTRIN) 200 MG tablet Take 400-600 mg by mouth every 6 (six) hours as needed (for pain).     valACYclovir (VALTREX) 1000 MG tablet Take two tablets by mouth at onset of cold sore and repeat two tablets in 12 hours 30 tablet 5   sertraline (ZOLOFT) 100 MG tablet Take 1 tablet (100 mg total) by mouth daily. 90 tablet 2   trimethoprim-polymyxin b (POLYTRIM) ophthalmic solution Place 2 drops  into the left eye every 4 (four) hours. 10 mL 0   No facility-administered medications prior to visit.    Allergies  Allergen Reactions   Dust Mite Extract     Pollen, animal dander    ROS Review of Systems  Constitutional:  Negative for activity change, appetite change, fatigue and fever.  HENT:  Negative for congestion, ear pain and trouble swallowing.   Eyes:  Negative for pain and visual disturbance.  Respiratory:  Negative for cough, shortness of breath and wheezing.   Cardiovascular:  Negative for chest pain and palpitations.  Gastrointestinal:  Negative for abdominal distention, abdominal pain, blood in stool, constipation, diarrhea, nausea, rectal pain and vomiting.  Endocrine: Negative for polydipsia and polyuria.  Genitourinary:  Negative for dysuria, hematuria and testicular pain.  Musculoskeletal:  Negative for arthralgias and  joint swelling.  Skin:  Negative for rash.  Neurological:  Negative for dizziness, syncope and headaches.  Hematological:  Negative for adenopathy.  Psychiatric/Behavioral:  Negative for confusion and dysphoric mood.      Objective:    Physical Exam Constitutional:      General: He is not in acute distress.    Appearance: He is well-developed.  HENT:     Head: Normocephalic and atraumatic.     Right Ear: External ear normal.     Left Ear: External ear normal.  Eyes:     Pupils: Pupils are equal, round, and reactive to light.  Neck:     Thyroid: No thyromegaly.  Cardiovascular:     Rate and Rhythm: Normal rate and regular rhythm.     Heart sounds: Normal heart sounds. No murmur heard. Pulmonary:     Effort: No respiratory distress.     Breath sounds: No wheezing or rales.  Abdominal:     General: Bowel sounds are normal. There is no distension.     Palpations: Abdomen is soft. There is no mass.     Tenderness: There is no abdominal tenderness. There is no guarding or rebound.  Musculoskeletal:     Cervical back: Normal range of motion and neck supple.     Right lower leg: No edema.     Left lower leg: No edema.  Lymphadenopathy:     Cervical: No cervical adenopathy.  Skin:    Findings: No rash.  Neurological:     Mental Status: He is alert and oriented to person, place, and time.     Cranial Nerves: No cranial nerve deficit.    BP 120/70 (BP Location: Left Arm, Patient Position: Sitting, Cuff Size: Normal)    Pulse 99    Temp 97.7 F (36.5 C) (Oral)    Ht 6' 1.5" (1.867 m)    Wt 195 lb 8 oz (88.7 kg)    SpO2 100%    BMI 25.44 kg/m  Wt Readings from Last 3 Encounters:  09/07/21 195 lb 8 oz (88.7 kg)  09/05/20 197 lb (89.4 kg)  03/26/19 198 lb 3.2 oz (89.9 kg)     Health Maintenance Due  Topic Date Due   Pneumococcal Vaccine 64-36 Years old (1 - PCV) Never done   HIV Screening  Never done   COVID-19 Vaccine (3 - Booster for Pfizer series) 04/15/2020    There  are no preventive care reminders to display for this patient.  Lab Results  Component Value Date   TSH 0.93 09/07/2020   Lab Results  Component Value Date   WBC 5.5 09/07/2020   HGB 16.5 09/07/2020  HCT 46.7 09/07/2020   MCV 86.0 09/07/2020   PLT 269 09/07/2020   Lab Results  Component Value Date   NA 140 09/07/2020   K 4.8 09/07/2020   CO2 27 09/07/2020   GLUCOSE 99 09/07/2020   BUN 14 09/07/2020   CREATININE 1.02 09/07/2020   BILITOT 0.6 09/07/2020   ALKPHOS 62 02/12/2017   AST 9 (L) 09/07/2020   ALT 16 09/07/2020   PROT 7.1 09/07/2020   ALBUMIN 4.6 02/12/2017   CALCIUM 9.9 09/07/2020   ANIONGAP 14 03/27/2014   GFR 98.26 02/12/2017   Lab Results  Component Value Date   CHOL 236 (H) 09/07/2020   Lab Results  Component Value Date   HDL 50 09/07/2020   Lab Results  Component Value Date   LDLCALC 163 (H) 09/07/2020   Lab Results  Component Value Date   TRIG 111 09/07/2020   Lab Results  Component Value Date   CHOLHDL 4.7 09/07/2020   Lab Results  Component Value Date   HGBA1C 5.4 02/13/2018      Assessment & Plan:   Problem List Items Addressed This Visit   None Visit Diagnoses     Physical exam    -  Primary   Relevant Orders   Basic metabolic panel   Lipid panel   CBC with Differential/Platelet   TSH   Hepatic function panel     Patient has history of mild hyperlipidemia but overall very low risk for CAD.  We did discuss down the road consideration for coronary calcium score.  Continue healthy lifestyle habits with low saturated fat diet and regular exercise. -Obtain screening labs as above -Tetanus booster by next year  No orders of the defined types were placed in this encounter.   Follow-up: No follow-ups on file.    Evelena Peat, MD

## 2022-02-28 ENCOUNTER — Telehealth: Payer: Self-pay | Admitting: Family Medicine

## 2022-02-28 MED ORDER — VALACYCLOVIR HCL 1 G PO TABS: ORAL_TABLET | ORAL | 5 refills | Status: DC

## 2022-02-28 NOTE — Telephone Encounter (Signed)
Rx sent 

## 2022-02-28 NOTE — Telephone Encounter (Signed)
Pt requesting a refill  valACYclovir (VALTREX) 1000 MG tablet to be sent to  [facility] #[id number] [facility] [place] - [address] AT [facility] [address] [facility] [address] Phone:  [phone]  Fax:  [phone]     Patient is on vacation.

## 2022-07-11 ENCOUNTER — Other Ambulatory Visit: Payer: Self-pay | Admitting: Internal Medicine

## 2022-07-11 MED ORDER — AMOXICILLIN-POT CLAVULANATE 875-125 MG PO TABS: 1.0000 | ORAL_TABLET | Freq: Two times a day (BID) | ORAL | 0 refills | Status: DC

## 2022-07-11 NOTE — Progress Notes (Signed)
Spoke with patient regarding symptoms of sinusitis, on going now for over two weeks and not improving. Had some initial improvement but now worsening over the last couple days. Has been using afrin nasal spray PRN. No fevers, cough, SOB. Sent Rx for augmentin BID x 7 days.

## 2022-08-19 ENCOUNTER — Encounter: Payer: PPO | Admitting: Family Medicine

## 2022-10-07 ENCOUNTER — Other Ambulatory Visit: Payer: Self-pay | Admitting: Internal Medicine

## 2022-10-07 ENCOUNTER — Other Ambulatory Visit: Payer: 59

## 2022-10-07 DIAGNOSIS — J069 Acute upper respiratory infection, unspecified: Secondary | ICD-10-CM

## 2022-10-07 NOTE — Addendum Note (Signed)
Addended by: Truddie Crumble on: 10/07/2022 11:09 AM   Modules accepted: Orders

## 2022-10-07 NOTE — Addendum Note (Signed)
Addended by: Dorian Pod A on: 10/07/2022 11:07 AM   Modules accepted: Orders

## 2022-10-08 LAB — SARS CORONAVIRUS 2 (TAT 6-24 HRS): SARS Coronavirus 2: POSITIVE — AB

## 2022-10-22 ENCOUNTER — Ambulatory Visit: Payer: 59 | Admitting: Family Medicine

## 2022-10-22 ENCOUNTER — Encounter: Payer: Self-pay | Admitting: Family Medicine

## 2022-10-22 VITALS — BP 118/80 | HR 71 | Temp 98.0°F | Ht 75.2 in | Wt 195.7 lb

## 2022-10-22 DIAGNOSIS — Z23 Encounter for immunization: Secondary | ICD-10-CM

## 2022-10-22 DIAGNOSIS — Z0000 Encounter for general adult medical examination without abnormal findings: Secondary | ICD-10-CM

## 2022-10-22 LAB — LIPID PANEL
Cholesterol: 227 mg/dL — ABNORMAL HIGH (ref 0–200)
HDL: 47 mg/dL (ref >39.00)
LDL Cholesterol: 154 mg/dL — ABNORMAL HIGH (ref 0–99)
NonHDL: 180.49
Total CHOL/HDL Ratio: 5
Triglycerides: 134 mg/dL (ref 0.0–149.0)
VLDL: 26.8 mg/dL (ref 0.0–40.0)

## 2022-10-22 LAB — HEPATIC FUNCTION PANEL
ALT: 22 U/L (ref 0–53)
AST: 12 U/L (ref 0–37)
Albumin: 5.3 g/dL — ABNORMAL HIGH (ref 3.5–5.2)
Alkaline Phosphatase: 69 U/L (ref 39–117)
Bilirubin, Direct: 0.1 mg/dL (ref 0.0–0.3)
Total Bilirubin: 0.8 mg/dL (ref 0.2–1.2)
Total Protein: 7.8 g/dL (ref 6.0–8.3)

## 2022-10-22 LAB — BASIC METABOLIC PANEL
BUN: 17 mg/dL (ref 6–23)
CO2: 26 mEq/L (ref 19–32)
Calcium: 9.8 mg/dL (ref 8.4–10.5)
Chloride: 101 mEq/L (ref 96–112)
Creatinine, Ser: 1.1 mg/dL (ref 0.40–1.50)
GFR: 89.33 mL/min (ref >60.00)
Glucose, Bld: 112 mg/dL — ABNORMAL HIGH (ref 70–99)
Potassium: 4 mEq/L (ref 3.5–5.1)
Sodium: 141 mEq/L (ref 135–145)

## 2022-10-22 LAB — CBC WITH DIFFERENTIAL/PLATELET
Basophils Absolute: 0 10*3/uL (ref 0.0–0.1)
Basophils Relative: 0.8 % (ref 0.0–3.0)
Eosinophils Absolute: 0.3 10*3/uL (ref 0.0–0.7)
Eosinophils Relative: 4.4 % (ref 0.0–5.0)
HCT: 45.7 % (ref 39.0–52.0)
Hemoglobin: 16 g/dL (ref 13.0–17.0)
Lymphocytes Relative: 35.4 % (ref 12.0–46.0)
Lymphs Abs: 2 10*3/uL (ref 0.7–4.0)
MCHC: 35.1 g/dL (ref 30.0–36.0)
MCV: 84.4 fl (ref 78.0–100.0)
Monocytes Absolute: 0.3 10*3/uL (ref 0.1–1.0)
Monocytes Relative: 5.2 % (ref 3.0–12.0)
Neutro Abs: 3.1 10*3/uL (ref 1.4–7.7)
Neutrophils Relative %: 54.2 % (ref 43.0–77.0)
Platelets: 295 10*3/uL (ref 150.0–400.0)
RBC: 5.41 Mil/uL (ref 4.22–5.81)
RDW: 12.4 % (ref 11.5–15.5)
WBC: 5.7 10*3/uL (ref 4.0–10.5)

## 2022-10-22 LAB — URINALYSIS
Bilirubin Urine: NEGATIVE
Hgb urine dipstick: NEGATIVE
Ketones, ur: NEGATIVE
Leukocytes,Ua: NEGATIVE
Nitrite: NEGATIVE
Specific Gravity, Urine: 1.025 (ref 1.000–1.030)
Total Protein, Urine: NEGATIVE
Urine Glucose: NEGATIVE
Urobilinogen, UA: 0.2 (ref 0.0–1.0)
pH: 6 (ref 5.0–8.0)

## 2022-10-22 LAB — TSH: TSH: 1.31 u[IU]/mL (ref 0.35–5.50)

## 2022-10-22 MED ORDER — VALACYCLOVIR HCL 1 G PO TABS: ORAL_TABLET | ORAL | 5 refills | Status: AC

## 2022-10-22 NOTE — Progress Notes (Signed)
Established Patient Office Visit  Subjective   Patient ID: Mark Mckenzie, male    DOB: 10-08-1990  Age: 32 y.o. MRN: [id number]  No chief complaint on file.   HPI   Lem seen for physical exam.  He and his wife just had their third child last week.  He is currently on paternity leave.  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  This has been, understandably, very stressful year.  He continues to exercise fairly regularly.  He notices increased heart rate with running but overall tolerating running without any other worrisome symptoms such as dizziness, dyspnea, or chest pain.  He has history of mild hyperlipidemia but overall low risk for CAD.  He is due for tetanus booster.  Flu vaccine already given.  Prior hepatitis C screen negative.  Social history-married with 3 children now.  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  Non-smoker.  Family history--mother had history of breast cancer and hyperlipidemia.  Father had coronary disease early 41s.  No family history of premature CAD.  Maternal uncle with prostate cancer history.  There was some question of his father having type 2 diabetes but he does not think so   Past Medical History:  Diagnosis Date   Acne    TB lung, latent    History reviewed. No pertinent surgical history.  reports that he has been smoking pipe. He has never used smokeless tobacco. He reports current alcohol use. He reports that he does not use drugs. family history includes Cancer in his maternal uncle; Cancer (age of onset: 85) in his mother; Depression in an other family member; Diabetes in his father; Heart disease (age of onset: 55) in his father; Hyperlipidemia in his mother; Neurologic Disorder in an other family member. Allergies  Allergen Reactions   Dust Mite Extract     Pollen, animal dander    Review of Systems  Constitutional:  Negative for  chills, fever, malaise/fatigue and weight loss.  HENT:  Negative for hearing loss.   Eyes:  Negative for blurred vision and double vision.  Respiratory:  Negative for cough and shortness of breath.   Cardiovascular:  Negative for chest pain, orthopnea, leg swelling and PND.  Gastrointestinal:  Negative for abdominal pain, blood in stool, constipation and diarrhea.  Genitourinary:  Negative for dysuria.  Skin:  Negative for rash.  Neurological:  Negative for dizziness, speech change, seizures, loss of consciousness and headaches.  Psychiatric/Behavioral:  Negative for depression.       Objective:     BP 118/80 (BP Location: Left Arm, Cuff Size: Normal)   Pulse 71   Temp 98 F (36.7 C) (Oral)   Ht 6' 3.2" (1.91 m)   Wt 195 lb 11.2 oz (88.8 kg)   SpO2 100%   BMI 24.33 kg/m    Physical Exam Vitals reviewed.  Constitutional:      General: He is not in acute distress.    Appearance: He is well-developed.  HENT:     Head: Normocephalic and atraumatic.     Right Ear: External ear normal.     Left Ear: External ear normal.  Eyes:     Conjunctiva/sclera: Conjunctivae normal.     Pupils: Pupils are equal, round, and reactive to light.  Neck:     Thyroid: No thyromegaly.  Cardiovascular:     Rate and Rhythm: Normal rate and regular rhythm.  Heart sounds: Normal heart sounds. No murmur heard.    No gallop.  Pulmonary:     Effort: No respiratory distress.     Breath sounds: No wheezing or rales.  Abdominal:     General: Bowel sounds are normal. There is no distension.     Palpations: Abdomen is soft. There is no mass.     Tenderness: There is no abdominal tenderness. There is no guarding or rebound.  Musculoskeletal:     Cervical back: Normal range of motion and neck supple.  Lymphadenopathy:     Cervical: No cervical adenopathy.  Skin:    Findings: No rash.  Neurological:     Mental Status: He is alert and oriented to person, place, and time.     Cranial Nerves: No  cranial nerve deficit.      No results found for any visits on 10/22/22.    The ASCVD Risk score (Arnett DK, et al., 2019) failed to calculate for the following reasons:   The 2019 ASCVD risk score is only valid for ages 68 to 38    Assessment & Plan:   Problem List Items Addressed This Visit   None Visit Diagnoses     Need for tetanus booster    -  Primary   Relevant Orders   Tdap vaccine greater than or equal to 7yo IM (Completed)   Physical exam       Relevant Orders   Basic metabolic panel   Lipid panel   CBC with Differential/Platelet   TSH   Hepatic function panel   Urinalysis     Generally healthy 32 year old male.  He has intermittent cold sores and refilled Valtrex for 1 year.  Tetanus booster given.  Recommend screening labs as above.  Continue regular exercise habits.  Continue annual flu vaccine  No follow-ups on file.    Carolann Littler, MD

## 2022-10-23 ENCOUNTER — Encounter: Payer: Self-pay | Admitting: Family Medicine

## 2022-10-26 ENCOUNTER — Other Ambulatory Visit: Payer: Self-pay

## 2022-10-26 ENCOUNTER — Encounter (HOSPITAL_BASED_OUTPATIENT_CLINIC_OR_DEPARTMENT_OTHER): Payer: Self-pay | Admitting: Emergency Medicine

## 2022-10-26 ENCOUNTER — Emergency Department (HOSPITAL_BASED_OUTPATIENT_CLINIC_OR_DEPARTMENT_OTHER)
Admission: EM | Admit: 2022-10-26 | Discharge: 2022-10-26 | Disposition: A | Discharge status: Home / Self Care | Payer: 59 | Attending: Emergency Medicine | Admitting: Emergency Medicine

## 2022-10-26 DIAGNOSIS — M79651 Pain in right thigh: Secondary | ICD-10-CM | POA: Diagnosis not present

## 2022-10-26 DIAGNOSIS — M79652 Pain in left thigh: Secondary | ICD-10-CM | POA: Insufficient documentation

## 2022-10-26 DIAGNOSIS — M79604 Pain in right leg: Secondary | ICD-10-CM

## 2022-10-26 DIAGNOSIS — M79605 Pain in left leg: Secondary | ICD-10-CM | POA: Diagnosis not present

## 2022-10-26 LAB — BASIC METABOLIC PANEL
Anion gap: 9 (ref 5–15)
BUN: 18 mg/dL (ref 6–20)
CO2: 28 mmol/L (ref 22–32)
Calcium: 9.8 mg/dL (ref 8.9–10.3)
Chloride: 103 mmol/L (ref 98–111)
Creatinine, Ser: 1.14 mg/dL (ref 0.61–1.24)
GFR, Estimated: >60 mL/min (ref >60)
Glucose, Bld: 114 mg/dL — ABNORMAL HIGH (ref 70–99)
Potassium: 3.8 mmol/L (ref 3.5–5.1)
Sodium: 140 mmol/L (ref 135–145)

## 2022-10-26 LAB — URINALYSIS, ROUTINE W REFLEX MICROSCOPIC
Bilirubin Urine: NEGATIVE
Glucose, UA: NEGATIVE mg/dL
Hgb urine dipstick: NEGATIVE
Ketones, ur: NEGATIVE mg/dL
Leukocytes,Ua: NEGATIVE
Nitrite: NEGATIVE
Protein, ur: NEGATIVE mg/dL
Specific Gravity, Urine: 1.007 (ref 1.005–1.030)
pH: 6 (ref 5.0–8.0)

## 2022-10-26 LAB — CBC WITH DIFFERENTIAL/PLATELET
Abs Immature Granulocytes: 0.01 10*3/uL (ref 0.00–0.07)
Basophils Absolute: 0.1 10*3/uL (ref 0.0–0.1)
Basophils Relative: 1 %
Eosinophils Absolute: 0.4 10*3/uL (ref 0.0–0.5)
Eosinophils Relative: 5 %
HCT: 42.7 % (ref 39.0–52.0)
Hemoglobin: 15.4 g/dL (ref 13.0–17.0)
Immature Granulocytes: 0 %
Lymphocytes Relative: 36 %
Lymphs Abs: 2.8 10*3/uL (ref 0.7–4.0)
MCH: 29.6 pg (ref 26.0–34.0)
MCHC: 36.1 g/dL — ABNORMAL HIGH (ref 30.0–36.0)
MCV: 82.1 fL (ref 80.0–100.0)
Monocytes Absolute: 0.5 10*3/uL (ref 0.1–1.0)
Monocytes Relative: 6 %
Neutro Abs: 4 10*3/uL (ref 1.7–7.7)
Neutrophils Relative %: 52 %
Platelets: 304 10*3/uL (ref 150–400)
RBC: 5.2 MIL/uL (ref 4.22–5.81)
RDW: 11.5 % (ref 11.5–15.5)
WBC: 7.8 10*3/uL (ref 4.0–10.5)
nRBC: 0 % (ref 0.0–0.2)

## 2022-10-26 LAB — CK: Total CK: 170 U/L (ref 49–397)

## 2022-10-26 MED ORDER — SODIUM CHLORIDE 0.9 % IV BOLUS
1000.0000 mL | Freq: Once | INTRAVENOUS | Status: AC
  Administered 2022-10-26: 1000 mL via INTRAVENOUS

## 2022-10-26 NOTE — ED Triage Notes (Signed)
  Patient comes in with bilateral leg pain from mountain biking yesterday after not doing it for a long time.  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  Patient denies any dark/concentrated urine but states he has been drinking a lot of water.  No N/V.  Pain 5/10, sore/aching in thighs.

## 2022-10-26 NOTE — ED Provider Notes (Signed)
[facility] Provider Note   CSN: [id number] Arrival date & time: 10/26/22  0105     History  Chief Complaint  Patient presents with   Leg Pain    Mark Mckenzie is a 32 y.o. male.  Patient is a 32 year old male presenting with bilateral leg pain.  He reports going on a mountain bike ride this afternoon which she has not done in quite some time.  He reports pedaling up hills and exerting himself somewhat vigorously.  This evening, he began having cramping and pain in his thighs.  This is worse when he ambulates.  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  He denies any dark urine.  Pain is worse when he attempts to ambulate and relieved somewhat with rest.  The history is provided by the patient.       Home Medications Prior to Admission medications   Medication Sig Start Date End Date Taking? Authorizing Provider  ibuprofen (ADVIL,MOTRIN) 200 MG tablet Take 400-600 mg by mouth every 6 (six) hours as needed (for pain).    [provider]  valACYclovir (VALTREX) 1000 MG tablet Take two tablets by mouth at onset of cold sore and repeat two tablets in 12 hours 10/22/22   Eulas Post, MD      Allergies    Dust mite extract    Review of Systems   Review of Systems  All other systems reviewed and are negative.   Physical Exam Updated Vital Signs BP (!) 161/78 (BP Location: Right Arm)   Pulse 88   Temp 98.6 F (37 C) (Oral)   Resp 18   Ht 6' 3"$  (1.905 m)   Wt 88.5 kg   SpO2 100%   BMI 24.37 kg/m  Physical Exam Vitals and nursing note reviewed.  Constitutional:      General: He is not in acute distress.    Appearance: Normal appearance.  HENT:     Head: Normocephalic and atraumatic.  Pulmonary:     Effort: Pulmonary effort is normal.  Musculoskeletal:     Comments: Bilateral lower extremities are grossly normal in appearance.  There is no  calf pain or tenderness.  PT pulses are easily palpable and motor and sensation are intact throughout the entire leg and foot.  Skin:    General: Skin is warm and dry.  Neurological:     Mental Status: He is alert.     ED Results / Procedures / Treatments   Labs (all labs ordered are listed, but only abnormal results are displayed) Labs Reviewed  BASIC METABOLIC PANEL  CBC WITH DIFFERENTIAL/PLATELET  CK    EKG None  Radiology No results found.  Procedures Procedures    Medications Ordered in ED Medications  sodium chloride 0.9 % bolus 1,000 mL (has no administration in time range)    ED Course/ Medical Decision Making/ A&P  Patient is a 32 year old male with no significant past medical history presenting with bilateral thigh pain.  He reports riding his mountain bike vigorously earlier in the day.  He is concerned he may have rhabdomyolysis.  He arrives here with stable vital signs and is clinically well-appearing.  Laboratory studies obtained including CBC, metabolic panel, and total CK.  These were all unremarkable.  Urinalysis is basically clear.  Patient has been hydrated with 1 L of normal saline.  I see no indication for rhabdomyolysis or other  acute process.  Feel as though he can safely be discharged with rest and as needed return.  Final Clinical Impression(s) / ED Diagnoses Final diagnoses:  None    Rx / DC Orders ED Discharge Orders     None         Veryl Speak, MD 10/26/22 [phone]

## 2022-10-26 NOTE — ED Notes (Addendum)
Patient ambulating to restroom, steady gait.

## 2022-10-26 NOTE — ED Notes (Signed)
Pt agreeable with d/c plan as discussed by provider- this nurse has verbally reinforced d/c instructions and provided pt with written copy.  Pt acknowledges verbal understanding and denies any addl questions concerns needs- ambulatory independently at d/c with stead gait - no acute changes/distress

## 2022-10-26 NOTE — Discharge Instructions (Signed)
Rest.  Drink plenty of fluids.  Return to the emergency department if you experience any new and/or concerning symptoms.

## 2022-11-08 ENCOUNTER — Encounter: Payer: Self-pay | Admitting: Family Medicine

## 2022-11-08 ENCOUNTER — Telehealth (INDEPENDENT_AMBULATORY_CARE_PROVIDER_SITE_OTHER): Payer: 59 | Admitting: Family Medicine

## 2022-11-08 VITALS — Ht 75.0 in | Wt 195.0 lb

## 2022-11-08 DIAGNOSIS — F411 Generalized anxiety disorder: Secondary | ICD-10-CM

## 2022-11-08 MED ORDER — SERTRALINE HCL 50 MG PO TABS: 50.0000 mg | ORAL_TABLET | Freq: Every day | ORAL | 3 refills | Status: DC

## 2022-11-08 NOTE — Progress Notes (Signed)
Patient ID: Mark Mckenzie, male   DOB: 1990/09/20, 32 y.o.   MRN: [id number]  Virtual Visit via Video Note  I connected with Linward Natal on 11/08/22 at  1:15 PM EST by a video enabled telemedicine application and verified that I am speaking with the correct person using two identifiers.  Location patient: home Location provider:work or home office Persons participating in the virtual visit: patient, provider  I discussed the limitations of evaluation and management by telemedicine and the availability of in person appointments. The patient expressed understanding and agreed to proceed.   HPI: Brae called mostly to discuss depression and anxiety symptoms.  He has longstanding history of anxiety.  Recent physical and had blood sugar that was slightly elevated 112 which was surprising.  He recalls having C-peptide years ago which apparently was "low normal ".  No polyuria or polydipsia.  He did lose 10 pounds recently after norovirus but has gained about half of that back.  He exercises fairly regularly.  His main concern is recurrence of depression and anxiety symptoms.  He has stress of 3 children and is currently in residency.  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  He had past history of panic disorder but denies any recent full-fledged panic attacks.  He has daily chronic anxiety and feels like he has some social anxiety components as well.  Was treated for years with sertraline and eventually titrated to 100 mg and did fairly well with that and eventually tapered himself off couple years ago.  He feels like he battles with poor self-esteem at times.   ROS: See pertinent positives and negatives per HPI.  Past Medical History:  Diagnosis Date   Acne    TB lung, latent     History reviewed. No pertinent surgical history.  Family History  Problem Relation Age of Onset   Depression Other    Neurologic Disorder Other    Diabetes Father        type 2   Heart disease  Father 19       CAD   Hyperlipidemia Mother    Cancer Mother 68       breast   Cancer Maternal Uncle        prostate cancer    SOCIAL HX: Smoker.  He is married with 3 children.  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   Current Outpatient Medications:    ibuprofen (ADVIL,MOTRIN) 200 MG tablet, Take 400-600 mg by mouth every 6 (six) hours as needed (for pain)., Disp: , Rfl:    sertraline (ZOLOFT) 50 MG tablet, Take 1 tablet (50 mg total) by mouth daily., Disp: 90 tablet, Rfl: 3   valACYclovir (VALTREX) 1000 MG tablet, Take two tablets by mouth at onset of cold sore and repeat two tablets in 12 hours, Disp: 60 tablet, Rfl: 5  EXAM:  VITALS per patient if applicable:  GENERAL: alert, oriented, appears well and in no acute distress  HEENT: atraumatic, conjunttiva clear, no obvious abnormalities on inspection of external nose and ears  NECK: normal movements of the head and neck  LUNGS: on inspection no signs of respiratory distress, breathing rate appears normal, no obvious gross SOB, gasping or wheezing  CV: no obvious cyanosis  MS: moves all visible extremities without noticeable abnormality  PSYCH/NEURO: pleasant and cooperative, no obvious depression or anxiety, speech and thought processing grossly intact  ASSESSMENT AND PLAN:  Discussed the following assessment and plan:  Generalized anxiety disorder.  He has some overlap  with depression and anxiety symptoms.  Past history of panic disorder.  Some elements of social anxiety.  We discussed starting back sertraline 50 mg once daily and give feedback in a few weeks regarding titration.  He knows he can titrate up to 75 or 100 mg in 3 to 4 weeks depending on response. -He had counseling in the past and would encourage continued counseling for ongoing symptoms -Follow-up sooner for any progressive depression or anxiety symptoms -Consider A1c with follow-up labs in the future and possible C-peptide  level with future lab     I discussed the assessment and treatment plan with the patient. The patient was provided an opportunity to ask questions and all were answered. The patient agreed with the plan and demonstrated an understanding of the instructions.   The patient was advised to call back or seek an in-person evaluation if the symptoms worsen or if the condition fails to improve as anticipated.     Carolann Littler, MD

## 2022-11-10 ENCOUNTER — Encounter: Payer: Self-pay | Admitting: Family Medicine

## 2022-11-10 DIAGNOSIS — R739 Hyperglycemia, unspecified: Secondary | ICD-10-CM

## 2022-11-10 DIAGNOSIS — E785 Hyperlipidemia, unspecified: Secondary | ICD-10-CM

## 2022-11-15 NOTE — Addendum Note (Signed)
Addended by: Nilda Riggs on: 11/15/2022 08:24 AM   Modules accepted: Orders

## 2022-11-20 DIAGNOSIS — R7303 Prediabetes: Secondary | ICD-10-CM | POA: Diagnosis not present

## 2022-12-02 ENCOUNTER — Ambulatory Visit (INDEPENDENT_AMBULATORY_CARE_PROVIDER_SITE_OTHER): Payer: 59 | Admitting: Family Medicine

## 2022-12-02 ENCOUNTER — Encounter: Payer: Self-pay | Admitting: Family Medicine

## 2022-12-02 VITALS — BP 126/78 | HR 79 | Temp 98.3°F | Ht 75.0 in | Wt 185.1 lb

## 2022-12-02 DIAGNOSIS — S70362A Insect bite (nonvenomous), left thigh, initial encounter: Secondary | ICD-10-CM | POA: Diagnosis not present

## 2022-12-02 DIAGNOSIS — W57XXXA Bitten or stung by nonvenomous insect and other nonvenomous arthropods, initial encounter: Secondary | ICD-10-CM

## 2022-12-02 NOTE — Progress Notes (Signed)
   Established Patient Office Visit  Subjective   Patient ID: Mark Mckenzie, male    DOB: 16-Jul-1991  Age: 32 y.o. MRN: [id number]  Chief Complaint  Patient presents with   Insect Bite    X2 days, Patient reports redness    HPI   Tick bite left inner thigh noted on Saturday.  He thinks he was only bitten in for couple of hours.  He described this as a [place] tick.  Has had some mild pruritus and mild local erythema.  No fever, headaches, or other skin rash.  He believes he was able to get the tick out in entirety.    Past Medical History:  Diagnosis Date   Acne    TB lung, latent    History reviewed. No pertinent surgical history.  reports that he has been smoking pipe. He has never used smokeless tobacco. He reports current alcohol use. He reports that he does not use drugs. family history includes Cancer in his maternal uncle; Cancer (age of onset: 31) in his mother; Depression in an other family member; Diabetes in his father; Heart disease (age of onset: 37) in his father; Hyperlipidemia in his mother; Neurologic Disorder in an other family member. Allergies  Allergen Reactions   Dust Mite Extract     Pollen, animal dander    Review of Systems  Constitutional:  Negative for chills and fever.  Skin:  Positive for rash.  Neurological:  Negative for headaches.      Objective:     BP 126/78 (BP Location: Left Arm, Patient Position: Sitting, Cuff Size: Normal)   Pulse 79   Temp 98.3 F (36.8 C) (Oral)   Ht 6\' 3"  (1.905 m)   Wt 185 lb 1.6 oz (84 kg)   SpO2 98%   BMI 23.14 kg/m  BP Readings from Last 3 Encounters:  12/02/22 126/78  10/26/22 (!) 161/78  10/22/22 118/80   Wt Readings from Last 3 Encounters:  12/02/22 185 lb 1.6 oz (84 kg)  11/08/22 195 lb (88.5 kg)  10/26/22 195 lb (88.5 kg)      Physical Exam Vitals reviewed.  Constitutional:      General: He is not in acute distress.    Appearance: Normal appearance.  Cardiovascular:     Rate and  Rhythm: Normal rate.  Skin:    Comments: Left inner thigh reveals very small punctate vesicle with approxi-1 cm surrounding zone of erythema  Neurological:     Mental Status: He is alert.      No results found for any visits on 12/02/22.    The ASCVD Risk score (Arnett DK, et al., 2019) failed to calculate for the following reasons:   The 2019 ASCVD risk score is only valid for ages 41 to 64    Assessment & Plan:   Recent tick bite with [place] tick.  Evidence for mild local allergic reaction at this point.  We discussed tickborne illness in general.  Follow-up for any unexplained fever, progressive headaches, new rashes, etc.   We did discuss the fact that [place] bites can be associated with alpha gal allergy  Carolann Littler, MD

## 2023-01-06 ENCOUNTER — Encounter: Payer: Self-pay | Admitting: Family Medicine

## 2023-01-07 ENCOUNTER — Other Ambulatory Visit: Payer: Self-pay | Admitting: Family

## 2023-01-07 MED ORDER — SERTRALINE HCL 100 MG PO TABS: 100.0000 mg | ORAL_TABLET | Freq: Every day | ORAL | 1 refills | Status: DC

## 2023-01-30 ENCOUNTER — Emergency Department (HOSPITAL_COMMUNITY)
Admission: EM | Admit: 2023-01-30 | Discharge: 2023-01-30 | Disposition: A | Discharge status: Home / Self Care | Payer: 59 | Attending: Emergency Medicine | Admitting: Emergency Medicine

## 2023-01-30 ENCOUNTER — Encounter (HOSPITAL_COMMUNITY): Payer: Self-pay | Admitting: Emergency Medicine

## 2023-01-30 ENCOUNTER — Other Ambulatory Visit: Payer: Self-pay

## 2023-01-30 DIAGNOSIS — R109 Unspecified abdominal pain: Secondary | ICD-10-CM | POA: Diagnosis not present

## 2023-01-30 DIAGNOSIS — R112 Nausea with vomiting, unspecified: Secondary | ICD-10-CM | POA: Insufficient documentation

## 2023-01-30 DIAGNOSIS — R197 Diarrhea, unspecified: Secondary | ICD-10-CM | POA: Diagnosis not present

## 2023-01-30 DIAGNOSIS — R000 Tachycardia, unspecified: Secondary | ICD-10-CM | POA: Diagnosis not present

## 2023-01-30 DIAGNOSIS — R42 Dizziness and giddiness: Secondary | ICD-10-CM | POA: Diagnosis not present

## 2023-01-30 DIAGNOSIS — E860 Dehydration: Secondary | ICD-10-CM | POA: Diagnosis not present

## 2023-01-30 DIAGNOSIS — R457 State of emotional shock and stress, unspecified: Secondary | ICD-10-CM | POA: Diagnosis not present

## 2023-01-30 LAB — CBC
HCT: 46.9 % (ref 39.0–52.0)
Hemoglobin: 16.5 g/dL (ref 13.0–17.0)
MCH: 29.4 pg (ref 26.0–34.0)
MCHC: 35.2 g/dL (ref 30.0–36.0)
MCV: 83.5 fL (ref 80.0–100.0)
Platelets: 325 10*3/uL (ref 150–400)
RBC: 5.62 MIL/uL (ref 4.22–5.81)
RDW: 11.4 % — ABNORMAL LOW (ref 11.5–15.5)
WBC: 15.3 10*3/uL — ABNORMAL HIGH (ref 4.0–10.5)
nRBC: 0 % (ref 0.0–0.2)

## 2023-01-30 LAB — COMPREHENSIVE METABOLIC PANEL
ALT: 21 U/L (ref 0–44)
AST: 23 U/L (ref 15–41)
Albumin: 4.9 g/dL (ref 3.5–5.0)
Alkaline Phosphatase: 63 U/L (ref 38–126)
Anion gap: 15 (ref 5–15)
BUN: 14 mg/dL (ref 6–20)
CO2: 21 mmol/L — ABNORMAL LOW (ref 22–32)
Calcium: 10 mg/dL (ref 8.9–10.3)
Chloride: 101 mmol/L (ref 98–111)
Creatinine, Ser: 1.29 mg/dL — ABNORMAL HIGH (ref 0.61–1.24)
GFR, Estimated: >60 mL/min (ref >60)
Glucose, Bld: 173 mg/dL — ABNORMAL HIGH (ref 70–99)
Potassium: 4 mmol/L (ref 3.5–5.1)
Sodium: 137 mmol/L (ref 135–145)
Total Bilirubin: 1.1 mg/dL (ref 0.3–1.2)
Total Protein: 7.3 g/dL (ref 6.5–8.1)

## 2023-01-30 LAB — LIPASE, BLOOD: Lipase: 30 U/L (ref 11–51)

## 2023-01-30 MED ORDER — LACTATED RINGERS IV BOLUS
1000.0000 mL | Freq: Once | INTRAVENOUS | Status: AC
  Administered 2023-01-30: 1000 mL via INTRAVENOUS

## 2023-01-30 MED ORDER — ONDANSETRON 4 MG PO TBDP: 4.0000 mg | ORAL_TABLET | Freq: Three times a day (TID) | ORAL | 0 refills | Status: AC | PRN

## 2023-01-30 MED ORDER — LORAZEPAM 2 MG/ML IJ SOLN
0.5000 mg | Freq: Once | INTRAMUSCULAR | Status: AC
  Administered 2023-01-30: 0.5 mg via INTRAVENOUS
  Filled 2023-01-30: qty 1

## 2023-01-30 MED ORDER — ONDANSETRON HCL 4 MG/2ML IJ SOLN
4.0000 mg | Freq: Once | INTRAMUSCULAR | Status: AC
  Administered 2023-01-30: 4 mg via INTRAVENOUS
  Filled 2023-01-30: qty 2

## 2023-01-30 NOTE — ED Provider Notes (Signed)
[facility] Emergency Department Provider Note MRN:  [id number]  Arrival date & time: 01/30/23     Chief Complaint   Vomiting   History of Present Illness   Mark Mckenzie is a 32 y.o. year-old male presents to the ED with chief complaint of nausea, non-bloody vomiting, and non-bloody diarrhea.  States that he has been having symptoms for about 12 hours.  Reports crampy abdominal pain, but no focal tenderness.  Denies fevers.  States that he is quite anxious.  Has received 4mg  zofran and LR by EMS.    History provided by patient.   Review of Systems  Pertinent positive and negative review of systems noted in HPI.    Physical Exam   Vitals:   01/30/23 0319 01/30/23 0418  BP: (!) 139/59 117/72  Pulse:  87  Resp: 17 (!) 21  Temp: 97.9 F (36.6 C)   SpO2: 100% 100%    CONSTITUTIONAL:  non-toxic appearing, NAD NEURO:  Alert and oriented x 3, CN 3-12 grossly intact EYES:  eyes equal and reactive ENT/NECK:  Supple, no stridor  CARDIO:  tachycardic, regular rhythm, appears well-perfused  PULM:  No respiratory distress, CTAB GI/GU:  non-distended, no focal tenderness, rebound or guarding MSK/SPINE:  No gross deformities, no edema, moves all extremities  SKIN:  no rash, atraumatic   *Additional and/or pertinent findings included in MDM below  Diagnostic and Interventional Summary    EKG Interpretation  Date/Time:    Ventricular Rate:    PR Interval:    QRS Duration:   QT Interval:    QTC Calculation:   R Axis:     Text Interpretation:         Labs Reviewed  COMPREHENSIVE METABOLIC PANEL - Abnormal; Notable for the following components:      Result Value   CO2 21 (*)    Glucose, Bld 173 (*)    Creatinine, Ser 1.29 (*)    All other components within normal limits  CBC - Abnormal; Notable for the following components:   WBC 15.3 (*)    RDW 11.4 (*)    All other components within normal limits  LIPASE, BLOOD  URINALYSIS,  ROUTINE W REFLEX MICROSCOPIC    No orders to display    Medications  lactated ringers bolus 1,000 mL (0 mLs Intravenous Stopped 01/30/23 0415)  ondansetron (ZOFRAN) injection 4 mg (4 mg Intravenous Given 01/30/23 0323)  LORazepam (ATIVAN) injection 0.5 mg (0.5 mg Intravenous Given 01/30/23 0323)  lactated ringers bolus 1,000 mL (1,000 mLs Intravenous New Bag/Given 01/30/23 0415)     Procedures  /  Critical Care Procedures  ED Course and Medical Decision Making  I have reviewed the triage vital signs, the nursing notes, and pertinent available records from the EMR.  Social Determinants Affecting Complexity of Care: Patient has no clinically significant social determinants affecting this chief complaint..   ED Course:    Medical Decision Making Patient here with about 12 hours of nausea, vomiting, and diarrhea.  Concern for dehydration.  Will check labs and give fluids and antiemetic.  He seems a bit nervous, will give a small dose of ativan.  Patient reassessed.  He states that he is feeling much better now.  Labs discussed below.  Amount and/or Complexity of Data Reviewed Labs: ordered.    Details: Glucose is 173, he is pre-diabetic and has been referred to endo.  Doubt DKA given n/v/d.  Anion gap is 15.   No longer vomiting after  fluids and treatment in ED Creatinine slightly elevated at 1.29.  Recommend recheck with PCP in a week or two.  Risk Prescription drug management.     Consultants: No consultations were needed in caring for this patient.   Treatment and Plan: Emergency department workup does not suggest an emergent condition requiring admission or immediate intervention beyond  what has been performed at this time. The patient is safe for discharge and has  been instructed to return immediately for worsening symptoms, change in  symptoms or any other concerns  Patient discussed with attending physician, Dr. Pilar Plate, who agrees with plan for outpatient follow-up of  hyperglycemia.  Final Clinical Impressions(s) / ED Diagnoses     ICD-10-CM   1. Nausea vomiting and diarrhea  R11.2    R19.7       ED Discharge Orders          Ordered    ondansetron (ZOFRAN-ODT) 4 MG disintegrating tablet  Every 8 hours PRN        01/30/23 0454              Discharge Instructions Discussed with and Provided to Patient:     Discharge Instructions      Your ER workup is consistent with mild dehydration in the setting of nausea, vomiting, and diarrhea.  Your glucose was a bit high today at 173.  It looks like your PCP referred you to endocrinology.  Keep your appointment.  I suspect you'll need to have repeat fasting labs and an A1C.  You creatinine was slightly elevated today at 1.29, likely indicating slight dehydration from the vomiting and diarrhea.  I recommend a recheck in 1-2 weeks.  Please return if your symptoms change or worsen.       Roxy Horseman, PA-C 01/30/23 1610    Sabas Sous, MD 01/30/23 0730

## 2023-01-30 NOTE — Discharge Instructions (Addendum)
Your ER workup is consistent with mild dehydration in the setting of nausea, vomiting, and diarrhea.  Your glucose was a bit high today at 173.  It looks like your PCP referred you to endocrinology.  Keep your appointment.  I suspect you'll need to have repeat fasting labs and an A1C.  You creatinine was slightly elevated today at 1.29, likely indicating slight dehydration from the vomiting and diarrhea.  I recommend a recheck in 1-2 weeks.  Please return if your symptoms change or worsen.

## 2023-01-30 NOTE — ED Triage Notes (Addendum)
Patient BIB [facility] c/o n/v/d x 12 hours.   100/60 100 HR 18 G L AC 1000 cc LR 4 mg zofran

## 2023-03-12 DIAGNOSIS — R7303 Prediabetes: Secondary | ICD-10-CM | POA: Diagnosis not present

## 2023-08-26 ENCOUNTER — Other Ambulatory Visit: Payer: Self-pay | Admitting: Family

## 2024-05-11 ENCOUNTER — Ambulatory Visit: Payer: Self-pay | Admitting: Family Medicine

## 2024-05-12 ENCOUNTER — Ambulatory Visit: Payer: Self-pay | Admitting: Family Medicine

## 2024-05-18 ENCOUNTER — Ambulatory Visit (INDEPENDENT_AMBULATORY_CARE_PROVIDER_SITE_OTHER): Payer: PRIVATE HEALTH INSURANCE | Admitting: Family Medicine

## 2024-05-18 VITALS — BP 138/80 | HR 75 | Temp 98.0°F | Wt 191.1 lb

## 2024-05-18 DIAGNOSIS — R1013 Epigastric pain: Secondary | ICD-10-CM | POA: Diagnosis not present

## 2024-05-18 NOTE — Patient Instructions (Signed)
 Consider trial of Pepcid for GI complaints and let me know if having any persistent symptoms

## 2024-05-18 NOTE — Progress Notes (Signed)
 Established Patient Office Visit  Subjective   Patient ID: Mark Mckenzie, male    DOB: [birth year]  Age: 33 y.o. MRN: [id number]  Chief Complaint  Patient presents with   Blood In Stools   Abdominal Pain    HPI   Mark Mckenzie is seen with about 74-month history of intermittent epigastric abdominal pain.  Symptoms are very intermittent and usually 2 out of 10 in intensity and he describes as a dull ache usually about an hour after eating.  1 seems to make it worse.  No appetite or weight changes.  Denies any melena.  No clear or bloody stools.  He had a couple occasions where he fell like he may have had somewhat of a orange discoloration of the water but did not see any clear blood in his stool.  No right upper quadrant pain.  No significant radiation.  Does not take any regular nonsteroidals.  No regular alcohol use.  Has not tried any regular use of antiacids.  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  Past Medical History:  Diagnosis Date   Acne    TB lung, latent    No past surgical history on file.  reports that he has been smoking pipe. He has never used smokeless tobacco. He reports current alcohol use. He reports that he does not use drugs. family history includes Cancer in his maternal uncle; Cancer (age of onset: 30) in his mother; Depression in an other family member; Diabetes in his father; Heart disease (age of onset: 11) in his father; Hyperlipidemia in his mother; Neurologic Disorder in an other family member. Allergies  Allergen Reactions   Dust Mite Extract     Pollen, animal dander    Review of Systems  Constitutional:  Negative for chills and weight loss.  Cardiovascular:  Negative for chest pain.  Gastrointestinal:  Positive for abdominal pain. Negative for blood in stool, diarrhea, melena, nausea and vomiting.      Objective:     BP  138/80 (BP Location: Left Arm, Cuff Size: Normal)   Pulse 75   Temp 98 F (36.7 C) (Oral)   Wt 191 lb 1.6 oz (86.7 kg)   SpO2 99%   BMI 25.21 kg/m  BP Readings from Last 3 Encounters:  05/18/24 138/80  01/30/23 114/73  12/02/22 126/78   Wt Readings from Last 3 Encounters:  05/18/24 191 lb 1.6 oz (86.7 kg)  01/30/23 180 lb (81.6 kg)  12/02/22 185 lb 1.6 oz (84 kg)      Physical Exam Vitals reviewed.  Constitutional:      General: He is not in acute distress.    Appearance: He is not ill-appearing.  Cardiovascular:     Rate and Rhythm: Normal rate and regular rhythm.  Abdominal:     Comments: Nondistended.  Normal bowel sounds.  Soft and nontender.  No hepatomegaly or splenomegaly noted.  No guarding or rebound.  Neurological:     Mental Status: He is alert.      No results found for any visits on 05/18/24.    The ASCVD Risk score (Arnett DK, et al., 2019) failed to calculate for the following reasons:   The 2019 ASCVD risk score is only valid for ages 48 to 3    Assessment & Plan:   Intermittent epigastric pain.  Suspect probably intermittent gastritis.  Does  not have any red flags such as appetite change, weight loss, melena, hematemesis, etc.  -Suggested trial of over-the-counter Pepcid 20 mg twice daily.  If not getting relief with that consider over-the-counter PPI such as Nexium or omeprazole. - If symptoms persist in spite of above set up follow-up and get further laboratory evaluation.  He is planning on setting up complete physical soon.  Wolm Scarlet, MD
